# Patient Record
Sex: Male | Born: 1965 | Race: White | Hispanic: No | Marital: Single | Smoking: Current every day smoker
Health system: Southern US, Community
[De-identification: ages and names within clinical notes are randomized; demographics above are authoritative.]

## PROBLEM LIST (undated history)

## (undated) DIAGNOSIS — F419 Anxiety disorder, unspecified: Secondary | ICD-10-CM

## (undated) DIAGNOSIS — K219 Gastro-esophageal reflux disease without esophagitis: Secondary | ICD-10-CM

## (undated) DIAGNOSIS — E559 Vitamin D deficiency, unspecified: Secondary | ICD-10-CM

## (undated) DIAGNOSIS — I359 Nonrheumatic aortic valve disorder, unspecified: Secondary | ICD-10-CM

## (undated) DIAGNOSIS — R011 Cardiac murmur, unspecified: Secondary | ICD-10-CM

## (undated) DIAGNOSIS — I739 Peripheral vascular disease, unspecified: Secondary | ICD-10-CM

## (undated) DIAGNOSIS — I517 Cardiomegaly: Secondary | ICD-10-CM

## (undated) DIAGNOSIS — I350 Nonrheumatic aortic (valve) stenosis: Secondary | ICD-10-CM

## (undated) DIAGNOSIS — I099 Rheumatic heart disease, unspecified: Secondary | ICD-10-CM

## (undated) DIAGNOSIS — F1010 Alcohol abuse, uncomplicated: Secondary | ICD-10-CM

## (undated) DIAGNOSIS — K5909 Other constipation: Secondary | ICD-10-CM

## (undated) DIAGNOSIS — I10 Essential (primary) hypertension: Secondary | ICD-10-CM

## (undated) DIAGNOSIS — R0600 Dyspnea, unspecified: Secondary | ICD-10-CM

## (undated) DIAGNOSIS — I7121 Aneurysm of the ascending aorta, without rupture: Secondary | ICD-10-CM

## (undated) DIAGNOSIS — Z79899 Other long term (current) drug therapy: Secondary | ICD-10-CM

## (undated) DIAGNOSIS — I712 Thoracic aortic aneurysm, without rupture: Secondary | ICD-10-CM

## (undated) DIAGNOSIS — I4892 Unspecified atrial flutter: Secondary | ICD-10-CM

## (undated) DIAGNOSIS — E782 Mixed hyperlipidemia: Secondary | ICD-10-CM

## (undated) HISTORY — DX: Thoracic aortic aneurysm, without rupture: I71.2

## (undated) HISTORY — DX: Cardiac murmur, unspecified: R01.1

## (undated) HISTORY — DX: Nonrheumatic aortic valve disorder, unspecified: I35.9

## (undated) HISTORY — DX: Essential (primary) hypertension: I10

## (undated) HISTORY — DX: Gastro-esophageal reflux disease without esophagitis: K21.9

## (undated) HISTORY — DX: Aneurysm of the ascending aorta, without rupture: I71.21

## (undated) HISTORY — DX: Mixed hyperlipidemia: E78.2

## (undated) HISTORY — DX: Rheumatic heart disease, unspecified: I09.9

## (undated) HISTORY — DX: Other constipation: K59.09

## (undated) HISTORY — DX: Other long term (current) drug therapy: Z79.899

## (undated) HISTORY — DX: Vitamin D deficiency, unspecified: E55.9

## (undated) HISTORY — DX: Cardiomegaly: I51.7

## (undated) HISTORY — DX: Anxiety disorder, unspecified: F41.9

## (undated) HISTORY — PX: AORTIC VALVE REPLACEMENT: SHX41

## (undated) HISTORY — DX: Nonrheumatic aortic (valve) stenosis: I35.0

## (undated) HISTORY — DX: Peripheral vascular disease, unspecified: I73.9

---

## 1984-08-07 HISTORY — PX: TENDON REPAIR: SHX5111

## 1986-08-07 HISTORY — PX: APPENDECTOMY: SHX54

## 2015-12-06 HISTORY — PX: OTHER SURGICAL HISTORY: SHX169

## 2016-04-26 ENCOUNTER — Encounter: Payer: Self-pay | Admitting: *Deleted

## 2016-04-26 ENCOUNTER — Ambulatory Visit (INDEPENDENT_AMBULATORY_CARE_PROVIDER_SITE_OTHER): Payer: BLUE CROSS/BLUE SHIELD | Admitting: Cardiology

## 2016-04-26 VITALS — BP 145/103 | HR 73 | Ht 65.0 in | Wt 147.0 lb

## 2016-04-26 DIAGNOSIS — Z01812 Encounter for preprocedural laboratory examination: Secondary | ICD-10-CM

## 2016-04-26 DIAGNOSIS — I483 Typical atrial flutter: Secondary | ICD-10-CM | POA: Diagnosis not present

## 2016-04-26 NOTE — Patient Instructions (Signed)
Medication Instructions:    Your physician recommends that you continue on your current medications as directed. Please refer to the Current Medication list given to you today.  --- If you need a refill on your cardiac medications before your next appointment, please call your pharmacy. ---  Labwork:  Your physician recommends that you return for lab work week of 10/2-10/6  Testing/Procedures: Your physician has recommended that you have an Atrial flutter Ablation. Catheter ablation is a medical procedure used to treat some cardiac arrhythmias (irregular heartbeats). During catheter ablation, a long, thin, flexible tube is put into a blood vessel in your groin (upper thigh), or neck. This tube is called an ablation catheter. It is then guided to your heart through the blood vessel. Radio frequency waves destroy small areas of heart tissue where abnormal heartbeats may cause an arrhythmia to start. Please see the instruction sheet given to you today.  Follow-Up:  Your physician recommends that you schedule a follow-up appointment in: 4 weeks, after your procedure on 05/17/16, with Dr. Curt Lambert  Thank you for choosing [facility]!!   Trinidad Curet, RN [phone]    Any Other Special Instructions Will Be Listed Below (If Applicable). Cardiac Ablation Cardiac ablation is a procedure to disable a small amount of heart tissue in very specific places. The heart has many electrical connections. Sometimes these connections are abnormal and can cause the heart to beat very fast or irregularly. By disabling some of the problem areas, heart rhythm can be improved or made normal. Ablation is done for people who:   Have Wolff-Parkinson-White syndrome.   Have other fast heart rhythms (tachycardia).   Have taken medicines for an abnormal heart rhythm (arrhythmia) that resulted in:   No success.   Side effects.   May have a high-risk heartbeat that could result in death.  LET [facility] CARE PROVIDER KNOW ABOUT:   Any allergies you have or any previous reactions you have had to X-ray dye, food (such as seafood), medicine, or tape.   All medicines you are taking, including vitamins, herbs, eye drops, creams, and over-the-counter medicines.   Previous problems you or members of your family have had with the use of anesthetics.   Any blood disorders you have.   Previous surgeries or procedures (such as a kidney transplant) you have had.   Medical conditions you have (such as kidney failure).  RISKS AND COMPLICATIONS Generally, cardiac ablation is a safe procedure. However, problems can occur and include:   Increased risk of cancer. Depending on how long it takes to do the ablation, the dose of radiation can be high.  Bruising and bleeding where a thin, flexible tube (catheter) was inserted during the procedure.   Bleeding into the chest, especially into the sac that surrounds the heart (serious).  Need for a permanent pacemaker if the normal electrical system is damaged.   The procedure may not be fully effective, and this may not be recognized for months. Repeat ablation procedures are sometimes required. BEFORE THE PROCEDURE   Follow any instructions from your health care provider regarding eating and drinking before the procedure.   Take your medicines as directed at regular times with water, unless instructed otherwise by your health care provider. If you are taking diabetes medicine, including insulin, ask how you are to take it and if there are any special instructions you should follow. It is common to adjust insulin dosing the day of the ablation.  PROCEDURE  An ablation is  usually performed in a catheterization laboratory with the guidance of fluoroscopy. Fluoroscopy is a type of X-ray that helps your health care provider see images of your heart during the procedure.   An ablation is a minimally invasive procedure. This means a small cut  (incision) is made in either your neck or groin. Your health care provider will decide where to make the incision based on your medical history and physical exam.  An IV tube will be started before the procedure begins. You will be given an anesthetic or medicine to help you relax (sedative).  The skin on your neck or groin will be numbed. A needle will be inserted into a large vein in your neck or groin and catheters will be threaded to your heart.  A special dye that shows up on fluoroscopy pictures may be injected through the catheter. The dye helps your health care provider see the area of the heart that needs treatment.  The catheter has electrodes on the tip. When the area of heart tissue that is causing the arrhythmia is found, the catheter tip will send an electrical current to the area and "scar" the tissue. Three types of energy can be used to ablate the heart tissue:   Heat (radiofrequency energy).   Laser energy.   Extreme cold (cryoablation).   When the area of the heart has been ablated, the catheter will be taken out. Pressure will be held on the insertion site. This will help the insertion site clot and keep it from bleeding. A bandage will be placed on the insertion site.  AFTER THE PROCEDURE   After the procedure, you will be taken to a recovery area where your vital signs (blood pressure, heart rate, and breathing) will be monitored. The insertion site will also be monitored for bleeding.   You will need to lie still for 4-6 hours. This is to ensure you do not bleed from the catheter insertion site.    This information is not intended to replace advice given to you by your health care provider. Make sure you discuss any questions you have with your health care provider.   Document Released: 12/10/2008 Document Revised: 08/14/2014 Document Reviewed: 12/16/2012 Elsevier Interactive Patient Education [employer].

## 2016-04-26 NOTE — Progress Notes (Addendum)
Electrophysiology Office Note   Date:  05/01/2016   ID:  Malik Lambert, DOB 1965-11-09, MRN [id number]  PCP:  No PCP Per Patient  Primary Electrophysiologist:  Beldon Nowling Meredith Leeds, MD    Chief Complaint  Patient presents with  . Atrial Fibrillation     History of Present Illness: Malik Lambert is a 50 y.o. male who presents today for electrophysiology evaluation.   History of atrial fibrillation, atrial flutter, hyperlipidemia, hypertension, AVR. Recently started on Eliquis. 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 Approximately 8 weeks after his operation, he started to notice palpitations and fatigue. On follow-up with his cardiologist, he was noted to be in 2-1 atrial flutter. He was put on Eliquis. Since that time, he has continued to have mild fatigue and palpitations.   Today, he denies symptoms of chest pain, shortness of breath, orthopnea, PND, lower extremity edema, claudication, dizziness, presyncope, syncope, bleeding, or neurologic sequela. The patient is tolerating medications without difficulties and is otherwise without complaint today.    Past Medical History:  Diagnosis Date  . Acid reflux   . Anxiety disorder   . Aortic stenosis   . Aortic valve defect   . Ascending aortic aneurysm ([place])   . Chronic constipation   . Heart murmur   . Heart murmur   . High risk medication use   . Hypertension, essential   . LVH (left ventricular hypertrophy)   . Mixed hyperlipidemia   . PAD (peripheral artery disease) ([place])   . Rheumatic fever/heart disease   . Vitamin D deficiency    Past Surgical History:  Procedure Laterality Date  . APPENDECTOMY  1988  . arotic valve replacement  12/2015  . TENDON REPAIR  1986     Current Outpatient Prescriptions  Medication Sig Dispense Refill  . aspirin 81 MG chewable tablet Chew 81 mg by mouth.    Arne Cleveland 5 MG TABS tablet Use as directed 5 mg in  the mouth or throat 2 (two) times daily.  0  . folic acid (FOLVITE) 1 MG tablet Take 1 mg by mouth.    Marland Kitchen lisinopril (PRINIVIL,ZESTRIL) 20 MG tablet Take 20 mg by mouth.    . metoprolol (LOPRESSOR) 100 MG tablet Take 100 mg by mouth.    . pravastatin (PRAVACHOL) 20 MG tablet Take 20 mg by mouth.     No current facility-administered medications for this visit.     Allergies:   Review of patient's allergies indicates no known allergies.   Social History:  The patient  reports that he has been smoking.  He does not have any smokeless tobacco history on file. He reports that he drinks alcohol. He reports that he does not use drugs.   Family History:  The patient's family history includes Depression in his mother; Diabetes Mellitus II in his father.    ROS:  Please see the history of present illness.   Otherwise, review of systems is positive for fatigue, palpitations, leg swelling.   All other systems are reviewed and negative.    PHYSICAL EXAM: VS:  BP (!) 145/103 (BP Location: Left Arm, Patient Position: Sitting, Cuff Size: Normal)   Pulse 73   Ht 5\' 5"  (1.651 m)   Wt 147 lb (66.7 kg)   BMI 24.46 kg/m  , BMI Body mass index is 24.46 kg/m. GEN: Well nourished, well developed, in no acute distress  HEENT: normal  Neck: no JVD,  carotid bruits, or masses Cardiac: tachycardic, irregular; no murmurs, rubs, or gallops,no edema  Respiratory:  clear to auscultation bilaterally, normal work of breathing GI: soft, nontender, nondistended, + BS MS: no deformity or atrophy  Skin: warm and dry,  Neuro:  Strength and sensation are intact Psych: euthymic mood, full affect  EKG:  EKG is ordered today. Personal review of the ekg ordered shows anial flutter, rate 146  Recent Labs: No results found for requested labs within last 8760 hours.    Lipid Panel  No results found for: CHOL, TRIG, HDL, CHOLHDL, VLDL, LDLCALC, LDLDIRECT   Wt Readings from Last 3 Encounters:  04/26/16 147 lb (66.7  kg)      Other studies Reviewed: Additional studies/ records that were reviewed today include: TTE 04/14/16  Review of the above records today demonstrates:  Mild concentric LVH. Ejection fraction 50-55%. Left atrium markedly dilated. Left atrial diameter 5.6 cm. Right atrium moderately dilated. Normally functioning bioprosthetic aortic valve.   ASSESSMENT AND PLAN:  1.  Atrial fibrillation/atrial flutter: on Eliquis. According to the patient, he had atrial fibrillation and right after his operation, but converted to sinus rhythm. Approximately 8 weeks after his operation, he went into atrial flutter. He says that this occurred up to a month and a half ago. He was put on Eliquis 2 weeks ago. I discussed with him the options of ablation of his medical management. Risks and benefits of ablation were discussed. Risks include bleeding, heart block, stroke, and tamponade. He understands these risks and has agreed to the procedure. We Nature Vogelsang plan the procedure after a work trip for him in the beginning of October.   This patients CHA2DS2-VASc Score and unadjusted Ischemic Stroke Rate (% per year) is equal to 2.2 % stroke rate/year from a score of 2  Above score calculated as 1 point each if present [CHF, HTN, DM, Vascular=MI/PAD/Aortic Plaque, Age if 65-74, or Male] Above score calculated as 2 points each if present [Age > 75, or Stroke/TIA/TE]   2. Hypertension: Well-controlled on lisinopril, metoprolol  3. Hyperlipidemia: Continue pravastatin         Current medicines are reviewed at length with the patient today.   The patient does not have concerns regarding his medicines.  The following changes were made today:  none  Labs/ tests ordered today include:  Orders Placed This Encounter  Procedures  . Basic metabolic panel  . CBC w/Diff  . EKG 12-Lead     Disposition:   FU with Manuella Blackson 3 months  Signed, Curtistine Pettitt Meredith Leeds, MD  05/01/2016 4:39 PM     [address] [place] [address] [phone] (office) [phone] (fax)

## 2016-05-01 ENCOUNTER — Encounter: Payer: Self-pay | Admitting: *Deleted

## 2016-05-04 ENCOUNTER — Telehealth: Payer: Self-pay | Admitting: *Deleted

## 2016-05-04 NOTE — Telephone Encounter (Signed)
Patient is going to have pre procedure labs on 10/5 or 10/6 at lab in [facility]. We spent 20 minutes on the phone discussing procedure and restrictions. He is appreciative of my time to talk with him.

## 2016-05-08 ENCOUNTER — Telehealth: Payer: Self-pay | Admitting: Cardiology

## 2016-05-08 NOTE — Telephone Encounter (Signed)
Walk In Pt Form-Return to Work Paper-Dropped off patient needs completed. Gave to [facility]

## 2016-05-12 ENCOUNTER — Other Ambulatory Visit: Payer: Self-pay | Admitting: *Deleted

## 2016-05-12 ENCOUNTER — Encounter: Payer: Self-pay | Admitting: Cardiology

## 2016-05-17 ENCOUNTER — Ambulatory Visit (HOSPITAL_COMMUNITY)
Admission: RE | Admit: 2016-05-17 | Discharge: 2016-05-17 | Disposition: A | Discharge status: Home / Self Care | Payer: BLUE CROSS/BLUE SHIELD | Source: Ambulatory Visit | Attending: Cardiology | Admitting: Cardiology

## 2016-05-17 ENCOUNTER — Encounter (HOSPITAL_COMMUNITY): Payer: Self-pay | Admitting: Cardiology

## 2016-05-17 ENCOUNTER — Encounter (HOSPITAL_COMMUNITY)
Admission: RE | Disposition: A | Discharge status: Home / Self Care | Payer: Self-pay | Source: Ambulatory Visit | Attending: Cardiology

## 2016-05-17 DIAGNOSIS — I4892 Unspecified atrial flutter: Secondary | ICD-10-CM | POA: Diagnosis present

## 2016-05-17 DIAGNOSIS — I471 Supraventricular tachycardia: Secondary | ICD-10-CM | POA: Diagnosis not present

## 2016-05-17 HISTORY — DX: Unspecified atrial flutter: I48.92

## 2016-05-17 HISTORY — DX: Dyspnea, unspecified: R06.00

## 2016-05-17 HISTORY — PX: ELECTROPHYSIOLOGIC STUDY: SHX172A

## 2016-05-17 HISTORY — PX: ABLATION OF DYSRHYTHMIC FOCUS: SHX254

## 2016-05-17 SURGERY — A-FLUTTER/A-TACH/SVT ABLATION
Anesthesia: LOCAL

## 2016-05-17 MED ORDER — BUPIVACAINE HCL (PF) 0.25 % IJ SOLN
INTRAMUSCULAR | Status: AC
  Filled 2016-05-17: qty 60

## 2016-05-17 MED ORDER — SODIUM CHLORIDE 0.9 % IV SOLN
INTRAVENOUS | Status: DC
  Administered 2016-05-17: 08:00:00 via INTRAVENOUS

## 2016-05-17 MED ORDER — HEPARIN (PORCINE) IN NACL 2-0.9 UNIT/ML-% IJ SOLN
INTRAMUSCULAR | Status: AC
  Filled 2016-05-17: qty 500

## 2016-05-17 MED ORDER — MIDAZOLAM HCL 5 MG/5ML IJ SOLN
INTRAMUSCULAR | Status: AC
  Filled 2016-05-17: qty 5

## 2016-05-17 MED ORDER — BUPIVACAINE HCL (PF) 0.25 % IJ SOLN
INTRAMUSCULAR | Status: DC | PRN
  Administered 2016-05-17: 33 mL

## 2016-05-17 MED ORDER — APIXABAN 5 MG PO TABS
5.0000 mg | ORAL_TABLET | Freq: Two times a day (BID) | ORAL | Status: DC
  Filled 2016-05-17: qty 1

## 2016-05-17 MED ORDER — SODIUM CHLORIDE 0.9% FLUSH: 3.0000 mL | INTRAVENOUS | Status: DC | PRN

## 2016-05-17 MED ORDER — HEPARIN (PORCINE) IN NACL 2-0.9 UNIT/ML-% IJ SOLN
INTRAMUSCULAR | Status: DC | PRN
  Administered 2016-05-17: 500 mL via INTRAVENOUS

## 2016-05-17 MED ORDER — ONDANSETRON HCL 4 MG/2ML IJ SOLN: 4.0000 mg | Freq: Four times a day (QID) | INTRAMUSCULAR | Status: DC | PRN

## 2016-05-17 MED ORDER — METOPROLOL TARTRATE 100 MG PO TABS
100.0000 mg | ORAL_TABLET | Freq: Two times a day (BID) | ORAL | Status: DC
  Administered 2016-05-17: 100 mg via ORAL
  Filled 2016-05-17: qty 1

## 2016-05-17 MED ORDER — SODIUM CHLORIDE 0.9% FLUSH
3.0000 mL | Freq: Two times a day (BID) | INTRAVENOUS | Status: DC
  Administered 2016-05-17: 3 mL via INTRAVENOUS

## 2016-05-17 MED ORDER — ASPIRIN 81 MG PO CHEW
81.0000 mg | CHEWABLE_TABLET | Freq: Every day | ORAL | Status: DC
  Administered 2016-05-17: 81 mg via ORAL
  Filled 2016-05-17: qty 1

## 2016-05-17 MED ORDER — FENTANYL CITRATE (PF) 100 MCG/2ML IJ SOLN
INTRAMUSCULAR | Status: AC
  Filled 2016-05-17: qty 2

## 2016-05-17 MED ORDER — LISINOPRIL 20 MG PO TABS
20.0000 mg | ORAL_TABLET | Freq: Every day | ORAL | Status: DC
  Administered 2016-05-17: 20 mg via ORAL
  Filled 2016-05-17: qty 1

## 2016-05-17 MED ORDER — MIDAZOLAM HCL 5 MG/5ML IJ SOLN
INTRAMUSCULAR | Status: DC | PRN
  Administered 2016-05-17 (×9): 1 mg via INTRAVENOUS

## 2016-05-17 MED ORDER — ACETAMINOPHEN 325 MG PO TABS
650.0000 mg | ORAL_TABLET | ORAL | Status: DC | PRN
  Filled 2016-05-17: qty 2

## 2016-05-17 MED ORDER — PRAVASTATIN SODIUM 20 MG PO TABS
20.0000 mg | ORAL_TABLET | Freq: Every day | ORAL | Status: DC
  Administered 2016-05-17: 20 mg via ORAL
  Filled 2016-05-17: qty 1

## 2016-05-17 MED ORDER — FOLIC ACID 1 MG PO TABS
1.0000 mg | ORAL_TABLET | Freq: Every day | ORAL | Status: DC
  Administered 2016-05-17: 1 mg via ORAL
  Filled 2016-05-17: qty 1

## 2016-05-17 MED ORDER — FENTANYL CITRATE (PF) 100 MCG/2ML IJ SOLN
INTRAMUSCULAR | Status: DC | PRN
  Administered 2016-05-17 (×4): 25 ug via INTRAVENOUS

## 2016-05-17 MED ORDER — SODIUM CHLORIDE 0.9 % IV SOLN: 250.0000 mL | INTRAVENOUS | Status: DC | PRN

## 2016-05-17 SURGICAL SUPPLY — 11 items
BAG SNAP BAND KOVER 36X36 (MISCELLANEOUS) ×2 IMPLANT
CATH EZ STEER NAV 8MM D-F CUR (ABLATOR) ×2 IMPLANT
CATH JOSEPH QUAD ALLRED 6F REP (CATHETERS) ×2 IMPLANT
CATH WEBSTER BI DIR CS D-F CRV (CATHETERS) ×2 IMPLANT
PACK EP LATEX FREE (CUSTOM PROCEDURE TRAY) ×1
PACK EP LF (CUSTOM PROCEDURE TRAY) ×1 IMPLANT
PAD DEFIB LIFELINK (PAD) ×2 IMPLANT
PATCH CARTO3 (PAD) ×2 IMPLANT
SHEATH PINNACLE 6F 10CM (SHEATH) ×2 IMPLANT
SHEATH PINNACLE 7F 10CM (SHEATH) ×2 IMPLANT
SHEATH PINNACLE 8F 10CM (SHEATH) ×2 IMPLANT

## 2016-05-17 NOTE — H&P (Signed)
Malik Lambert is a 50 y.o. male with a history of atrial flutter.  He presents today for ablation.  He has been taking Eliquis for anticoagulation without missing doses.  On exam, tachycardic, no murmurs, lungs clear.  Risks and benefits of the procedure were explained.  Risks include but not limited to bleeding, tamponade, heart block, and stroke.  The patient understands the risks and has agreed to the procedure. All questions were answered.  Elroy Schembri Curt Bears, MD 05/17/2016 7:06 AM

## 2016-05-17 NOTE — Discharge Instructions (Signed)
No driving for 1 week. No lifting over 5 lbs for 1 week. No vigorous or sexual activity for 1 week. You may return to work on 05/24/16. Keep procedure site clean & dry. If you notice increased pain, swelling, bleeding or pus, call/return!  You may shower, but no soaking baths/hot tubs/pools for 1 week.    Heart-Healthy Eating Plan Many factors influence your heart health, including eating and exercise habits. Heart (coronary) risk increases with abnormal blood fat (lipid) levels. Heart-healthy meal planning includes limiting unhealthy fats, increasing healthy fats, and making other small dietary changes. This includes maintaining a healthy body weight to help keep lipid levels within a normal range. WHAT IS MY PLAN?  Your health care provider recommends that you:  Get no more than _________% of the total calories in your daily diet from fat.  Limit your intake of saturated fat to less than _________% of your total calories each day.  Limit the amount of cholesterol in your diet to less than _________ mg per day. WHAT TYPES OF FAT SHOULD I CHOOSE?  Choose healthy fats more often. Choose monounsaturated and polyunsaturated fats, such as olive oil and canola oil, flaxseeds, walnuts, almonds, and seeds.  Eat more omega-3 fats. Good choices include salmon, mackerel, sardines, tuna, flaxseed oil, and ground flaxseeds. Aim to eat fish at least two times each week.  Limit saturated fats. Saturated fats are primarily found in animal products, such as meats, butter, and cream. Plant sources of saturated fats include palm oil, palm kernel oil, and coconut oil.  Avoid foods with partially hydrogenated oils in them. These contain trans fats. Examples of foods that contain trans fats are stick margarine, some tub margarines, cookies, crackers, and other baked goods. WHAT GENERAL GUIDELINES DO I NEED TO FOLLOW?  Check food labels carefully to identify foods with trans fats or high amounts of saturated  fat.  Fill one half of your plate with vegetables and green salads. Eat 4-5 servings of vegetables per day. A serving of vegetables equals 1 cup of raw leafy vegetables,  cup of raw or cooked cut-up vegetables, or  cup of vegetable juice.  Fill one fourth of your plate with whole grains. Look for the word "whole" as the first word in the ingredient list.  Fill one fourth of your plate with lean protein foods.  Eat 4-5 servings of fruit per day. A serving of fruit equals one medium whole fruit,  cup of dried fruit,  cup of fresh, frozen, or canned fruit, or  cup of 100% fruit juice.  Eat more foods that contain soluble fiber. Examples of foods that contain this type of fiber are apples, broccoli, carrots, beans, peas, and barley. Aim to get 20-30 g of fiber per day.  Eat more home-cooked food and less restaurant, buffet, and fast food.  Limit or avoid alcohol.  Limit foods that are high in starch and sugar.  Avoid fried foods.  Cook foods by using methods other than frying. Baking, boiling, grilling, and broiling are all great options. Other fat-reducing suggestions include:  Removing the skin from poultry.  Removing all visible fats from meats.  Skimming the fat off of stews, soups, and gravies before serving them.  Steaming vegetables in water or broth.  Lose weight if you are overweight. Losing just 5-10% of your initial body weight can help your overall health and prevent diseases such as diabetes and heart disease.  Increase your consumption of nuts, legumes, and seeds to 4-5 servings per  week. One serving of dried beans or legumes equals  cup after being cooked, one serving of nuts equals 1 ounces, and one serving of seeds equals  ounce or 1 tablespoon.  You may need to monitor your salt (sodium) intake, especially if you have high blood pressure. Talk with your health care provider or dietitian to get more information about reducing sodium. WHAT FOODS CAN I  EAT? Grains Breads, including [place], white, pita, wheat, raisin, rye, oatmeal, and [place]. Tortillas that are neither fried nor made with lard or trans fat. Low-fat rolls, including hotdog and hamburger buns and English muffins. Biscuits. Muffins. Waffles. Pancakes. Light popcorn. Whole-grain cereals. Flatbread. Melba toast. Pretzels. Breadsticks. Rusks. Low-fat snacks and crackers, including oyster, saltine, matzo, graham, animal, and rye. Rice and pasta, including brown rice and those that are made with whole wheat. Vegetables All vegetables. Fruits All fruits, but limit coconut. Meats and Other Protein Sources Lean, well-trimmed beef, veal, pork, and lamb. Chicken and [place] without skin. All fish and shellfish. Wild duck, rabbit, pheasant, and venison. Egg whites or low-cholesterol egg substitutes. Dried beans, peas, lentils, and tofu.Seeds and most nuts. Dairy Low-fat or nonfat cheeses, including ricotta, string, and mozzarella. Skim or 1% milk that is liquid, powdered, or evaporated. Buttermilk that is made with low-fat milk. Nonfat or low-fat yogurt. Beverages Mineral water. Diet carbonated beverages. Sweets and Desserts Sherbets and fruit ices. Honey, jam, marmalade, jelly, and syrups. Meringues and gelatins. Pure sugar candy, such as hard candy, jelly beans, gumdrops, mints, marshmallows, and small amounts of dark chocolate. [employer] Eat all sweets and desserts in moderation. Fats and Oils Nonhydrogenated (trans-free) margarines. Vegetable oils, including soybean, sesame, sunflower, olive, peanut, safflower, corn, canola, and cottonseed. Salad dressings or mayonnaise that are made with a vegetable oil. Limit added fats and oils that you use for cooking, baking, salads, and as spreads. Other Cocoa powder. Coffee and tea. All seasonings and condiments. The items listed above may not be a complete list of recommended foods or beverages. Contact your dietitian for more  options. WHAT FOODS ARE NOT RECOMMENDED? Grains Breads that are made with saturated or trans fats, oils, or whole milk. Croissants. Butter rolls. Cheese breads. Sweet rolls. Donuts. Buttered popcorn. Chow mein noodles. High-fat crackers, such as cheese or butter crackers. Meats and Other Protein Sources Fatty meats, such as hotdogs, short ribs, sausage, spareribs, bacon, ribeye roast or steak, and mutton. High-fat deli meats, such as salami and bologna. Caviar. Domestic duck and goose. Organ meats, such as kidney, liver, sweetbreads, brains, gizzard, chitterlings, and heart. Dairy Cream, sour cream, cream cheese, and creamed cottage cheese. Whole milk cheeses, including blue (bleu), Monterey Jack, [place], [place], American, [place], [place], [place], [place], and [place]. Whole or 2% milk that is liquid, evaporated, or condensed. Whole buttermilk. Cream sauce or high-fat cheese sauce. Yogurt that is made from whole milk. Beverages Regular sodas and drinks with added sugar. Sweets and Desserts Frosting. Pudding. Cookies. Cakes other than angel food cake. Candy that has milk chocolate or white chocolate, hydrogenated fat, butter, coconut, or unknown ingredients. Buttered syrups. Full-fat ice cream or ice cream drinks. Fats and Oils Gravy that has suet, meat fat, or shortening. Cocoa butter, hydrogenated oils, palm oil, coconut oil, palm kernel oil. These can often be found in baked products, candy, fried foods, nondairy creamers, and whipped toppings. Solid fats and shortenings, including bacon fat, salt pork, lard, and butter. Nondairy cream substitutes, such as coffee creamers and sour cream substitutes. Salad dressings that are  made of unknown oils, cheese, or sour cream. The items listed above may not be a complete list of foods and beverages to avoid. Contact your dietitian for more information.   This information is not intended to replace advice given to you by your health care provider. Make sure  you discuss any questions you have with your health care provider.   Document Released: 05/02/2008 Document Revised: 08/14/2014 Document Reviewed: 01/15/2014 Elsevier Interactive Patient Education [employer].

## 2016-05-17 NOTE — Progress Notes (Signed)
Patient is seen by Dr. Curt Bears, cleared for discharge once bedrest is completed and ambulates without difficulty or site complication. (d/w RN) Procedure site stable Telemetry is SR VSS Activity restrictions were discussed with the patient by dr. Curt Bears F/u arranged.  Tommye Standard, PA-C  Leonides Sake, MD

## 2016-05-17 NOTE — Progress Notes (Signed)
Site area: Right groin a 6, 7, 8 french venous sheath was removed  Site Prior to Removal:  Level 0  Pressure Applied For 20 MINUTES    Bedrest Beginning at 1045a  Manual:   Yes.    Patient Status During Pull:  stable  Post Pull Groin Site:  Level 0  Post Pull Instructions Given:  Yes.    Post Pull Pulses Present:  Yes.    Dressing Applied:  Yes.    Comments:  VS remain stable during sheath pull

## 2016-05-22 ENCOUNTER — Telehealth: Payer: Self-pay | Admitting: Cardiology

## 2016-05-22 NOTE — Telephone Encounter (Signed)
Patient still not feeling well.  He is scheduled to see his primary cardiologist (Dr. Verdon Cummins office at [facility]) in the morning to address issues. Complains of bilateral calf pain -- right greater left.  States that they are hypersensitive.  Also reports bilateral pedal edema which is not new -- he had this problem prior to ablation.   He understands I will call Wednesday to follow up and see what orders/recommendations were made at cardiologist. He thanks me for calling.

## 2016-05-22 NOTE — Telephone Encounter (Signed)
New message    Patient calling C/O cramping in calf area more in right. S/p ablation on last Wednesday.    Patient aware appt in [place] with Dr. Curt Bears - wants nurse to clarify on which location patient needs to be seen at [facility] or [facility] due to paperwork saying on different date and time   Pt C/O swelling: STAT is pt has developed SOB within 24 hours  1. How long have you been experiencing swelling? Before the surgery    2. Where is the swelling located? Feet - both / hard time walking around   3.  Are you currently taking a "fluid pill"? No   4.  Are you currently SOB? No   5.  Have you traveled recently? No

## 2016-06-02 NOTE — Telephone Encounter (Signed)
Followed up with patient.  He tells me he is doing better.  Saw doc last week and they started Lasix.  States he has a couple more pills and then he is done with it.  Reports Lasix has helped reduce ankle swelling but he has also lost 9 pds. Reports cramping is gone. He follows up next Wednesday with PA in doctors office. He will call office if he needs anything further related to this issue.  Otherwise, he will see [place] near end of November for follow up. He thanks me for being so thoughtful and following up.

## 2016-06-28 ENCOUNTER — Ambulatory Visit: Payer: BLUE CROSS/BLUE SHIELD | Admitting: Cardiology

## 2016-06-28 ENCOUNTER — Ambulatory Visit (INDEPENDENT_AMBULATORY_CARE_PROVIDER_SITE_OTHER): Payer: BLUE CROSS/BLUE SHIELD | Admitting: Cardiology

## 2016-06-28 VITALS — BP 182/100 | HR 78 | Ht 65.0 in | Wt 145.0 lb

## 2016-06-28 DIAGNOSIS — I4892 Unspecified atrial flutter: Secondary | ICD-10-CM | POA: Diagnosis not present

## 2016-06-28 NOTE — Patient Instructions (Signed)
Medication Instructions: - Your physician recommends that you continue on your current medications as directed. Please refer to the Current Medication list given to you today.  Labwork: - none ordered  Procedures/Testing: - none ordered  Follow-Up: - Your physician wants you to follow-up in: 6 months with Dr. Curt Bears in the [facility] office.  You will receive a reminder letter in the mail two months in advance. If you don't receive a letter, please call our office to schedule the follow-up appointment.  Any Additional Special Instructions Will Be Listed Below (If Applicable).     If you need a refill on your cardiac medications before your next appointment, please call your pharmacy.

## 2016-06-28 NOTE — Progress Notes (Signed)
Electrophysiology Office Note   Date:  06/28/2016   ID:  Malik Lambert, DOB 1966-07-23, MRN [id number]  PCP:  Isaias Cowman, PA-C  Primary Electrophysiologist:  Constance Haw, MD    Chief Complaint  Patient presents with  . Atrial Flutter     History of Present Illness: Malik Lambert is a 50 y.o. male who presents today for electrophysiology evaluation.   History of atrial fibrillation, atrial flutter, hyperlipidemia, hypertension, AVR. Recently started on Eliquis. 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 He had an atrial flutter ablation complicated by a steam pop.  The procedure was canceled and block was not achieved across the CTI line. Since that time, he had significant lower extremity edema after the procedure. He was prescribed by mouth Lasix which greatly improved his edema.  Today, he denies symptoms of chest pain, shortness of breath, orthopnea, PND, lower extremity edema, claudication, dizziness, presyncope, syncope, bleeding, or neurologic sequela. The patient is tolerating medications without difficulties and is otherwise without complaint today.    Past Medical History:  Diagnosis Date  . Acid reflux   . Anxiety disorder   . Aortic stenosis   . Aortic valve defect   . Ascending aortic aneurysm ([place])   . Atrial flutter ([place])   . Chronic constipation   . Dyspnea   . Heart murmur   . Heart murmur   . High risk medication use   . Hypertension, essential   . LVH (left ventricular hypertrophy)   . Mixed hyperlipidemia   . PAD (peripheral artery disease) ([place])   . Rheumatic fever/heart disease   . Vitamin D deficiency    Past Surgical History:  Procedure Laterality Date  . ABLATION OF DYSRHYTHMIC FOCUS  05/17/2016   atrial flutter  . APPENDECTOMY  1988  . arotic valve replacement  12/2015  . ELECTROPHYSIOLOGIC STUDY N/A 05/17/2016   Procedure: A-Flutter Ablation;  Surgeon:  Will Meredith Leeds, MD;  Location: [facility];  Service: Cardiovascular;  Laterality: N/A;  . TENDON REPAIR  1986     Current Outpatient Prescriptions  Medication Sig Dispense Refill  . aspirin 81 MG chewable tablet Chew 81 mg by mouth daily.     Marland Kitchen ELIQUIS 5 MG TABS tablet Take 5 mg by mouth 2 (two) times daily.   0  . folic acid (FOLVITE) 1 MG tablet Take 1 mg by mouth daily.     Marland Kitchen lisinopril (PRINIVIL,ZESTRIL) 20 MG tablet Take 20 mg by mouth daily.     . metoprolol (LOPRESSOR) 100 MG tablet Take 100 mg by mouth 2 (two) times daily.     . pravastatin (PRAVACHOL) 20 MG tablet Take 20 mg by mouth daily.      No current facility-administered medications for this visit.     Allergies:   Patient has no known allergies.   Social History:  The patient  reports that he has been smoking Cigarettes.  He has a 30.00 pack-year smoking history. He has never used smokeless tobacco. He reports that he drinks alcohol. He reports that he does not use drugs.   Family History:  The patient's family history includes Depression in his mother; Diabetes Mellitus II in his father.    ROS:  Please see the history of present illness.   Otherwise, review of systems is positive for leg pain, joint swelling, balance problems, muscle pain.   All other systems are reviewed and negative.  PHYSICAL EXAM: VS:  BP (!) 182/100   Pulse 78   Ht 5\' 5"  (1.651 m)   Wt 145 lb (65.8 kg)   BMI 24.13 kg/m  , BMI Body mass index is 24.13 kg/m. GEN: Well nourished, well developed, in no acute distress  HEENT: normal  Neck: no JVD, carotid bruits, or masses Cardiac: RRR; no murmurs, rubs, or gallops,no edema  Respiratory:  clear to auscultation bilaterally, normal work of breathing GI: soft, nontender, nondistended, + BS MS: no deformity or atrophy  Skin: warm and dry,  Neuro:  Strength and sensation are intact Psych: euthymic mood, full affect  EKG:  EKG is ordered today. Personal review of the ekg  ordered shows sinus rhythm, anterior Q waves, rate 79  Recent Labs: No results found for requested labs within last 8760 hours.    Lipid Panel  No results found for: CHOL, TRIG, HDL, CHOLHDL, VLDL, LDLCALC, LDLDIRECT   Wt Readings from Last 3 Encounters:  06/28/16 145 lb (65.8 kg)  05/17/16 147 lb (66.7 kg)  04/26/16 147 lb (66.7 kg)      Other studies Reviewed: Additional studies/ records that were reviewed today include: TTE 04/14/16  Review of the above records today demonstrates:  Mild concentric LVH. Ejection fraction 50-55%. Left atrium markedly dilated. Left atrial diameter 5.6 cm. Right atrium moderately dilated. Normally functioning bioprosthetic aortic valve.   ASSESSMENT AND PLAN:  1.  Atrial fibrillation/atrial flutter: on Eliquis. Had flutter ablation complicated by steam pop. Block was not achieved across the ablation line. The patient wishes to have another attempt at ablation prior to the end of the year.  Risks and benefits discussed.  Risks include but not limited to bleeding, tamponade, heart block, and stroke.  The patient understands the risks of the procedure, but feels that he would prefer to wait and see if the arrhythmia returns.  This patients CHA2DS2-VASc Score and unadjusted Ischemic Stroke Rate (% per year) is equal to 2.2 % stroke rate/year from a score of 2  Above score calculated as 1 point each if present [CHF, HTN, DM, Vascular=MI/PAD/Aortic Plaque, Age if 65-74, or Male] Above score calculated as 2 points each if present [Age > 75, or Stroke/TIA/TE]  2. Hypertension: Elevated in clinic today. I have asked him to check his blood pressure every day to see if his blood pressure will return back to normal. He does say that his blood pressure at home is in the 120s. He will call us back with blood pressure results and we will adjust his medications at that time.  3. Hyperlipidemia: Continue pravastatin   Current medicines are reviewed at length with  the patient today.   The patient does not have concerns regarding his medicines.  The following changes were made today:  none  Labs/ tests ordered today include:  Orders Placed This Encounter  Procedures  . EKG 12-Lead     Disposition:   FU with Will Camnitz 6 months  Signed, Will Meredith Leeds, MD  06/28/2016 4:40 PM     [address] [place] [address] [phone] (office) [phone] (fax)

## 2017-01-27 ENCOUNTER — Inpatient Hospital Stay (HOSPITAL_COMMUNITY)
Admission: EM | Admit: 2017-01-27 | Discharge: 2017-02-02 | DRG: 481 | Disposition: A | Discharge status: Home Health | Payer: BLUE CROSS/BLUE SHIELD | Attending: Family Medicine | Admitting: Family Medicine

## 2017-01-27 ENCOUNTER — Emergency Department (HOSPITAL_COMMUNITY): Payer: BLUE CROSS/BLUE SHIELD

## 2017-01-27 ENCOUNTER — Encounter (HOSPITAL_COMMUNITY): Payer: Self-pay | Admitting: Emergency Medicine

## 2017-01-27 DIAGNOSIS — D638 Anemia in other chronic diseases classified elsewhere: Secondary | ICD-10-CM | POA: Diagnosis present

## 2017-01-27 DIAGNOSIS — I350 Nonrheumatic aortic (valve) stenosis: Secondary | ICD-10-CM | POA: Diagnosis present

## 2017-01-27 DIAGNOSIS — W109XXA Fall (on) (from) unspecified stairs and steps, initial encounter: Secondary | ICD-10-CM | POA: Diagnosis present

## 2017-01-27 DIAGNOSIS — E782 Mixed hyperlipidemia: Secondary | ICD-10-CM | POA: Diagnosis present

## 2017-01-27 DIAGNOSIS — S72141A Displaced intertrochanteric fracture of right femur, initial encounter for closed fracture: Secondary | ICD-10-CM | POA: Diagnosis present

## 2017-01-27 DIAGNOSIS — I739 Peripheral vascular disease, unspecified: Secondary | ICD-10-CM | POA: Diagnosis present

## 2017-01-27 DIAGNOSIS — K644 Residual hemorrhoidal skin tags: Secondary | ICD-10-CM | POA: Diagnosis present

## 2017-01-27 DIAGNOSIS — D62 Acute posthemorrhagic anemia: Secondary | ICD-10-CM | POA: Diagnosis present

## 2017-01-27 DIAGNOSIS — K5730 Diverticulosis of large intestine without perforation or abscess without bleeding: Secondary | ICD-10-CM | POA: Diagnosis present

## 2017-01-27 DIAGNOSIS — I4892 Unspecified atrial flutter: Secondary | ICD-10-CM | POA: Diagnosis present

## 2017-01-27 DIAGNOSIS — I10 Essential (primary) hypertension: Secondary | ICD-10-CM | POA: Diagnosis not present

## 2017-01-27 DIAGNOSIS — S7223XA Displaced subtrochanteric fracture of unspecified femur, initial encounter for closed fracture: Secondary | ICD-10-CM | POA: Diagnosis present

## 2017-01-27 DIAGNOSIS — M2540 Effusion, unspecified joint: Secondary | ICD-10-CM

## 2017-01-27 DIAGNOSIS — I959 Hypotension, unspecified: Secondary | ICD-10-CM | POA: Diagnosis present

## 2017-01-27 DIAGNOSIS — D509 Iron deficiency anemia, unspecified: Secondary | ICD-10-CM | POA: Diagnosis present

## 2017-01-27 DIAGNOSIS — K219 Gastro-esophageal reflux disease without esophagitis: Secondary | ICD-10-CM | POA: Diagnosis present

## 2017-01-27 DIAGNOSIS — D125 Benign neoplasm of sigmoid colon: Secondary | ICD-10-CM | POA: Diagnosis present

## 2017-01-27 DIAGNOSIS — M25461 Effusion, right knee: Secondary | ICD-10-CM | POA: Diagnosis present

## 2017-01-27 DIAGNOSIS — F10230 Alcohol dependence with withdrawal, uncomplicated: Secondary | ICD-10-CM | POA: Diagnosis not present

## 2017-01-27 DIAGNOSIS — E871 Hypo-osmolality and hyponatremia: Secondary | ICD-10-CM | POA: Diagnosis present

## 2017-01-27 DIAGNOSIS — R000 Tachycardia, unspecified: Secondary | ICD-10-CM | POA: Diagnosis not present

## 2017-01-27 DIAGNOSIS — Z952 Presence of prosthetic heart valve: Secondary | ICD-10-CM

## 2017-01-27 DIAGNOSIS — M25551 Pain in right hip: Secondary | ICD-10-CM

## 2017-01-27 DIAGNOSIS — W19XXXD Unspecified fall, subsequent encounter: Secondary | ICD-10-CM | POA: Diagnosis not present

## 2017-01-27 DIAGNOSIS — I110 Hypertensive heart disease with heart failure: Secondary | ICD-10-CM | POA: Diagnosis present

## 2017-01-27 DIAGNOSIS — D696 Thrombocytopenia, unspecified: Secondary | ICD-10-CM | POA: Diagnosis present

## 2017-01-27 DIAGNOSIS — F1010 Alcohol abuse, uncomplicated: Secondary | ICD-10-CM | POA: Diagnosis not present

## 2017-01-27 DIAGNOSIS — K921 Melena: Secondary | ICD-10-CM | POA: Diagnosis not present

## 2017-01-27 DIAGNOSIS — F17210 Nicotine dependence, cigarettes, uncomplicated: Secondary | ICD-10-CM | POA: Diagnosis present

## 2017-01-27 DIAGNOSIS — F10239 Alcohol dependence with withdrawal, unspecified: Secondary | ICD-10-CM | POA: Diagnosis present

## 2017-01-27 DIAGNOSIS — I5032 Chronic diastolic (congestive) heart failure: Secondary | ICD-10-CM | POA: Diagnosis present

## 2017-01-27 DIAGNOSIS — Z7982 Long term (current) use of aspirin: Secondary | ICD-10-CM

## 2017-01-27 DIAGNOSIS — I4891 Unspecified atrial fibrillation: Secondary | ICD-10-CM | POA: Diagnosis present

## 2017-01-27 DIAGNOSIS — Z953 Presence of xenogenic heart valve: Secondary | ICD-10-CM

## 2017-01-27 DIAGNOSIS — K922 Gastrointestinal hemorrhage, unspecified: Secondary | ICD-10-CM | POA: Diagnosis not present

## 2017-01-27 DIAGNOSIS — Z7901 Long term (current) use of anticoagulants: Secondary | ICD-10-CM

## 2017-01-27 DIAGNOSIS — Z8673 Personal history of transient ischemic attack (TIA), and cerebral infarction without residual deficits: Secondary | ICD-10-CM

## 2017-01-27 DIAGNOSIS — K7030 Alcoholic cirrhosis of liver without ascites: Secondary | ICD-10-CM | POA: Diagnosis present

## 2017-01-27 DIAGNOSIS — Z01810 Encounter for preprocedural cardiovascular examination: Secondary | ICD-10-CM | POA: Diagnosis not present

## 2017-01-27 DIAGNOSIS — Y909 Presence of alcohol in blood, level not specified: Secondary | ICD-10-CM | POA: Diagnosis present

## 2017-01-27 DIAGNOSIS — W19XXXA Unspecified fall, initial encounter: Secondary | ICD-10-CM

## 2017-01-27 DIAGNOSIS — I060 Rheumatic aortic stenosis: Secondary | ICD-10-CM | POA: Diagnosis present

## 2017-01-27 DIAGNOSIS — I493 Ventricular premature depolarization: Secondary | ICD-10-CM | POA: Diagnosis not present

## 2017-01-27 DIAGNOSIS — Z79899 Other long term (current) drug therapy: Secondary | ICD-10-CM

## 2017-01-27 DIAGNOSIS — K641 Second degree hemorrhoids: Secondary | ICD-10-CM | POA: Diagnosis present

## 2017-01-27 HISTORY — DX: Gastro-esophageal reflux disease without esophagitis: K21.9

## 2017-01-27 HISTORY — DX: Alcohol abuse, uncomplicated: F10.10

## 2017-01-27 LAB — MRSA PCR SCREENING: MRSA BY PCR: NEGATIVE

## 2017-01-27 LAB — HEPARIN LEVEL (UNFRACTIONATED): HEPARIN UNFRACTIONATED: 0.7 [IU]/mL (ref 0.30–0.70)

## 2017-01-27 LAB — BASIC METABOLIC PANEL
Anion gap: 15 (ref 5–15)
BUN: 9 mg/dL (ref 6–20)
CO2: 16 mmol/L — ABNORMAL LOW (ref 22–32)
Calcium: 8.3 mg/dL — ABNORMAL LOW (ref 8.9–10.3)
Chloride: 97 mmol/L — ABNORMAL LOW (ref 101–111)
Creatinine, Ser: 0.87 mg/dL (ref 0.61–1.24)
GFR calc Af Amer: >60 mL/min (ref >60)
GLUCOSE: 99 mg/dL (ref 65–99)
POTASSIUM: 4.9 mmol/L (ref 3.5–5.1)
Sodium: 128 mmol/L — ABNORMAL LOW (ref 135–145)

## 2017-01-27 LAB — HEMOGLOBIN AND HEMATOCRIT, BLOOD
HCT: 20.1 % — ABNORMAL LOW (ref 39.0–52.0)
HEMOGLOBIN: 7.5 g/dL — AB (ref 13.0–17.0)

## 2017-01-27 LAB — CBC WITH DIFFERENTIAL/PLATELET
BASOS PCT: 0 %
Basophils Absolute: 0 10*3/uL (ref 0.0–0.1)
EOS ABS: 0 10*3/uL (ref 0.0–0.7)
Eosinophils Relative: 0 %
HCT: 23.9 % — ABNORMAL LOW (ref 39.0–52.0)
HEMOGLOBIN: 9 g/dL — AB (ref 13.0–17.0)
LYMPHS PCT: 10 %
Lymphs Abs: 1.2 10*3/uL (ref 0.7–4.0)
MCH: 36.9 pg — AB (ref 26.0–34.0)
MCHC: 37.7 g/dL — ABNORMAL HIGH (ref 30.0–36.0)
MCV: 98 fL (ref 78.0–100.0)
Monocytes Absolute: 1.9 10*3/uL — ABNORMAL HIGH (ref 0.1–1.0)
Monocytes Relative: 16 %
NEUTROS PCT: 74 %
Neutro Abs: 8.5 10*3/uL — ABNORMAL HIGH (ref 1.7–7.7)
Platelets: 75 10*3/uL — ABNORMAL LOW (ref 150–400)
RBC: 2.44 MIL/uL — ABNORMAL LOW (ref 4.22–5.81)
RDW: 13.1 % (ref 11.5–15.5)
WBC: 11.6 10*3/uL — ABNORMAL HIGH (ref 4.0–10.5)

## 2017-01-27 LAB — TSH: TSH: 3.969 u[IU]/mL (ref 0.350–4.500)

## 2017-01-27 LAB — RETICULOCYTES
RBC.: 2.2 MIL/uL — AB (ref 4.22–5.81)
RETIC COUNT ABSOLUTE: 55 10*3/uL (ref 19.0–186.0)
Retic Ct Pct: 2.5 % (ref 0.4–3.1)

## 2017-01-27 LAB — PROTIME-INR
INR: 1.12
PROTHROMBIN TIME: 14.5 s (ref 11.4–15.2)

## 2017-01-27 LAB — APTT: APTT: 37 s — AB (ref 24–36)

## 2017-01-27 LAB — OSMOLALITY: Osmolality: 269 mOsm/kg — ABNORMAL LOW (ref 275–295)

## 2017-01-27 LAB — URIC ACID: Uric Acid, Serum: 4.8 mg/dL (ref 4.4–7.6)

## 2017-01-27 MED ORDER — LORAZEPAM 2 MG/ML IJ SOLN: 0.0000 mg | Freq: Two times a day (BID) | INTRAMUSCULAR | Status: AC

## 2017-01-27 MED ORDER — METOPROLOL TARTRATE 25 MG PO TABS: 100.0000 mg | ORAL_TABLET | Freq: Two times a day (BID) | ORAL | Status: DC

## 2017-01-27 MED ORDER — SODIUM CHLORIDE 0.9 % IV SOLN
INTRAVENOUS | Status: AC
  Administered 2017-01-27 – 2017-01-28 (×2): via INTRAVENOUS

## 2017-01-27 MED ORDER — MORPHINE SULFATE (PF) 2 MG/ML IV SOLN
2.0000 mg | INTRAVENOUS | Status: DC | PRN
  Administered 2017-01-28: 2 mg via INTRAVENOUS
  Filled 2017-01-27 (×2): qty 1

## 2017-01-27 MED ORDER — SODIUM CHLORIDE 0.9 % IV BOLUS (SEPSIS)
500.0000 mL | Freq: Once | INTRAVENOUS | Status: AC
  Administered 2017-01-27 (×2): 500 mL via INTRAVENOUS

## 2017-01-27 MED ORDER — LORAZEPAM 2 MG/ML IJ SOLN
1.0000 mg | Freq: Once | INTRAMUSCULAR | Status: AC
  Administered 2017-01-27: 1 mg via INTRAVENOUS
  Filled 2017-01-27: qty 1

## 2017-01-27 MED ORDER — ADULT MULTIVITAMIN W/MINERALS CH
1.0000 | ORAL_TABLET | Freq: Every day | ORAL | Status: DC
  Administered 2017-01-27 – 2017-02-01 (×5): 1 via ORAL
  Filled 2017-01-27 (×5): qty 1

## 2017-01-27 MED ORDER — LORAZEPAM 2 MG/ML IJ SOLN: 2.0000 mg | INTRAMUSCULAR | Status: AC | PRN

## 2017-01-27 MED ORDER — AMIODARONE HCL IN DEXTROSE 360-4.14 MG/200ML-% IV SOLN
60.0000 mg/h | INTRAVENOUS | Status: AC
  Administered 2017-01-27: 60 mg/h via INTRAVENOUS
  Filled 2017-01-27 (×2): qty 200

## 2017-01-27 MED ORDER — ALBUTEROL SULFATE (2.5 MG/3ML) 0.083% IN NEBU: 2.5000 mg | INHALATION_SOLUTION | RESPIRATORY_TRACT | Status: DC | PRN

## 2017-01-27 MED ORDER — THIAMINE HCL 100 MG/ML IJ SOLN
100.0000 mg | Freq: Every day | INTRAMUSCULAR | Status: DC
  Filled 2017-01-27: qty 2

## 2017-01-27 MED ORDER — HYDROMORPHONE HCL 2 MG/ML IJ SOLN
1.0000 mg | INTRAMUSCULAR | Status: AC | PRN
  Administered 2017-01-27 (×2): 1 mg via INTRAVENOUS
  Filled 2017-01-27 (×2): qty 1

## 2017-01-27 MED ORDER — SODIUM CHLORIDE 0.9 % IV SOLN
INTRAVENOUS | Status: DC
  Administered 2017-01-27: 15:00:00 via INTRAVENOUS

## 2017-01-27 MED ORDER — CHLORDIAZEPOXIDE HCL 5 MG PO CAPS
15.0000 mg | ORAL_CAPSULE | Freq: Three times a day (TID) | ORAL | Status: DC
  Administered 2017-01-27: 15 mg via ORAL
  Filled 2017-01-27: qty 1

## 2017-01-27 MED ORDER — AMIODARONE LOAD VIA INFUSION
150.0000 mg | Freq: Once | INTRAVENOUS | Status: AC
  Administered 2017-01-27: 150 mg via INTRAVENOUS
  Filled 2017-01-27: qty 83.34

## 2017-01-27 MED ORDER — SODIUM CHLORIDE 0.9 % IV BOLUS (SEPSIS)
1000.0000 mL | Freq: Once | INTRAVENOUS | Status: AC
  Administered 2017-01-27: 1000 mL via INTRAVENOUS

## 2017-01-27 MED ORDER — FOLIC ACID 1 MG PO TABS
1.0000 mg | ORAL_TABLET | Freq: Every day | ORAL | Status: DC
  Administered 2017-01-27 – 2017-02-01 (×5): 1 mg via ORAL
  Filled 2017-01-27 (×5): qty 1

## 2017-01-27 MED ORDER — AMIODARONE LOAD VIA INFUSION
150.0000 mg | Freq: Once | INTRAVENOUS | Status: DC
  Filled 2017-01-27: qty 83.34

## 2017-01-27 MED ORDER — VITAMIN B-1 100 MG PO TABS
100.0000 mg | ORAL_TABLET | Freq: Every day | ORAL | Status: DC
  Administered 2017-01-27 – 2017-02-02 (×6): 100 mg via ORAL
  Filled 2017-01-27 (×6): qty 1

## 2017-01-27 MED ORDER — HEPARIN (PORCINE) IN NACL 100-0.45 UNIT/ML-% IJ SOLN
1000.0000 [IU]/h | INTRAMUSCULAR | Status: DC
  Administered 2017-01-27: 1000 [IU]/h via INTRAVENOUS
  Filled 2017-01-27 (×2): qty 250

## 2017-01-27 MED ORDER — METOPROLOL TARTRATE 5 MG/5ML IV SOLN: 2.5000 mg | INTRAVENOUS | Status: DC | PRN

## 2017-01-27 MED ORDER — LORAZEPAM 2 MG/ML IJ SOLN
0.0000 mg | Freq: Four times a day (QID) | INTRAMUSCULAR | Status: AC
  Filled 2017-01-27: qty 1

## 2017-01-27 MED ORDER — FLUTICASONE PROPIONATE 50 MCG/ACT NA SUSP
2.0000 | Freq: Every day | NASAL | Status: DC
  Administered 2017-01-27 – 2017-02-02 (×6): 2 via NASAL
  Filled 2017-01-27: qty 16

## 2017-01-27 MED ORDER — LORAZEPAM 1 MG PO TABS
1.0000 mg | ORAL_TABLET | Freq: Four times a day (QID) | ORAL | Status: AC | PRN
  Administered 2017-01-29: 1 mg via ORAL
  Filled 2017-01-27: qty 1

## 2017-01-27 MED ORDER — AMIODARONE HCL IN DEXTROSE 360-4.14 MG/200ML-% IV SOLN: 30.0000 mg/h | INTRAVENOUS | Status: DC

## 2017-01-27 MED ORDER — PRAVASTATIN SODIUM 20 MG PO TABS
20.0000 mg | ORAL_TABLET | Freq: Every day | ORAL | Status: DC
  Administered 2017-01-27 – 2017-02-01 (×6): 20 mg via ORAL
  Filled 2017-01-27 (×6): qty 1

## 2017-01-27 MED ORDER — DILTIAZEM HCL-DEXTROSE 100-5 MG/100ML-% IV SOLN (PREMIX)
5.0000 mg/h | INTRAVENOUS | Status: DC
  Filled 2017-01-27: qty 100

## 2017-01-27 MED ORDER — SODIUM CHLORIDE 0.9 % IV SOLN: 1000.0000 mL | INTRAVENOUS | Status: DC

## 2017-01-27 MED ORDER — ONDANSETRON HCL 4 MG/2ML IJ SOLN
4.0000 mg | Freq: Four times a day (QID) | INTRAMUSCULAR | Status: DC | PRN
  Administered 2017-01-29: 4 mg via INTRAVENOUS

## 2017-01-27 MED ORDER — HYDROCODONE-ACETAMINOPHEN 5-325 MG PO TABS
1.0000 | ORAL_TABLET | Freq: Four times a day (QID) | ORAL | Status: DC | PRN
  Administered 2017-01-28 – 2017-01-29 (×3): 1 via ORAL
  Administered 2017-01-29 – 2017-02-02 (×13): 2 via ORAL
  Filled 2017-01-27 (×3): qty 2
  Filled 2017-01-27: qty 1
  Filled 2017-01-27 (×2): qty 2
  Filled 2017-01-27: qty 1
  Filled 2017-01-27 (×3): qty 2
  Filled 2017-01-27: qty 1
  Filled 2017-01-27 (×5): qty 2

## 2017-01-27 MED ORDER — DILTIAZEM LOAD VIA INFUSION
10.0000 mg | Freq: Once | INTRAVENOUS | Status: DC
  Filled 2017-01-27: qty 10

## 2017-01-27 MED ORDER — SODIUM CHLORIDE 0.9 % IV BOLUS (SEPSIS)
500.0000 mL | Freq: Once | INTRAVENOUS | Status: AC
  Administered 2017-01-27: 500 mL via INTRAVENOUS

## 2017-01-27 NOTE — H&P (Addendum)
TRH H&P   Patient Demographics:    Malik Lambert, is a 51 y.o. male  MRN: [id number]   DOB - 02/08/1966  Admit Date - 01/27/2017  Outpatient Primary MD for the patient is Secundino Ginger, PA-C  Outpatient Specialists: Dr Baird Kay    Patient coming from: Home  Chief Complaint  Patient presents with  . Fall  . Hip Pain      HPI:    Malik Lambert  is a 51 y.o. male, History of atrial fibrillation, rheumatic heart disease with severe aortic stenosis status post bovine aortic valve replacement surgery in May 2017 at [facility], on Eliquis and aspirin, essential hypertension, chronic diastolic dysfunction, PAD, dyslipidemia, severe alcohol use who lives at home alone and this morning tripped on a staircase and fell down, he subsequently had right-sided hip pain and came to the ER.  In the ER workup suggested he was in sinus tachycardia with a few runs of a flutter, right-sided intertrochanteric hip fracture, mild hyponatremia, mild thrombo-cytopenia, orthopedics was consulted and I was called to admit. Patient currently besides constant right-sided sharp hip pain which is nonradiating, worse with bearing weight and movement better with rest and pain medications, no other associated symptoms or complaints.  Denies any headache, no focal weakness, no chest pain or palpitations that he can feel, no abdominal pain, no blood in stool or urine, no focal weakness. He is getting little agitated and has fine tremors already.    Review of systems:    In addition to the HPI above,   No Fever-chills, No Headache, No changes with Vision or hearing, No problems swallowing food or Liquids, No Chest pain, Cough or Shortness of Breath, No  Abdominal pain, No Nausea or Vommitting, Bowel movements are regular, No Blood in stool or Urine, No dysuria, No new skin rashes or bruises, No new joints pains-aches, Except as above No new weakness, tingling, numbness in any extremity, No recent weight gain or loss, No polyuria, polydypsia or polyphagia, No significant Mental Stressors.  A full 10 point Review of Systems was done, except as stated above, all other Review of Systems were negative.   With Past History of the following :    Past Medical History:  Diagnosis Date  . Acid reflux   . Alcohol abuse   . Anxiety disorder   .  Aortic stenosis   . Aortic valve defect   . Ascending aortic aneurysm ([place])   . Atrial flutter ([place])   . Chronic constipation   . Dyspnea   . GERD (gastroesophageal reflux disease)   . Heart murmur   . Heart murmur   . High risk medication use   . Hypertension, essential   . LVH (left ventricular hypertrophy)   . Mixed hyperlipidemia   . PAD (peripheral artery disease) ([place])   . Rheumatic fever/heart disease   . Vitamin D deficiency       Past Surgical History:  Procedure Laterality Date  . ABLATION OF DYSRHYTHMIC FOCUS  05/17/2016   atrial flutter  . APPENDECTOMY  1988  . arotic valve replacement  12/2015  . ELECTROPHYSIOLOGIC STUDY N/A 05/17/2016   Procedure: A-Flutter Ablation;  Surgeon: Will Meredith Leeds, MD;  Location: [facility];  Service: Cardiovascular;  Laterality: N/A;  . TENDON REPAIR  1986      Social History:     Social History  Substance Use Topics  . Smoking status: Current Every Day Smoker    Packs/day: 1.00    Years: 30.00    Types: Cigarettes  . Smokeless tobacco: Never Used     Comment: uses vapor   . Alcohol use Yes     Comment: 3-4 beers per day         Family History :     Family History  Problem Relation Age of Onset  . Depression Mother   . Diabetes Mellitus II Father        Home Medications:   Prior to Admission medications    Medication Sig Start Date End Date Taking? Authorizing Provider  aspirin 81 MG chewable tablet Chew 81 mg by mouth daily. 01/03/16  Yes [provider]  ELIQUIS 5 MG TABS tablet Take 5 mg by mouth 2 (two) times daily.  04/19/16  Yes [provider]  fluticasone (FLONASE) 50 MCG/ACT nasal spray Place 2 sprays into both nostrils daily.   Yes [provider]  lisinopril (PRINIVIL,ZESTRIL) 20 MG tablet Take 20 mg by mouth daily.    Yes [provider]  metoprolol (LOPRESSOR) 100 MG tablet Take 100 mg by mouth 2 (two) times daily.  01/03/16  Yes [provider]  pravastatin (PRAVACHOL) 20 MG tablet Take 20 mg by mouth daily.    Yes [provider]     Allergies:    No Known Allergies   Physical Exam:   Vitals  Blood pressure 103/69, pulse (!) 128, temperature 98.9 F (37.2 C), temperature source Oral, resp. rate 12, SpO2 93 %.   1. General Middle-aged white male lying in bed in NAD,  appears anxious and has fine tremors,  2. Normal affect and insight, Not Suicidal or Homicidal, Awake Alert, Oriented X 3.  3. No F.N deficits, ALL C.Nerves Intact, Strength 5/5 all 4 extremities, Sensation intact all 4 extremities, Plantars down going.  4. Ears and Eyes appear Normal, Conjunctivae clear, PERRLA. Moist Oral Mucosa.  5. Supple Neck, No JVD, No cervical lymphadenopathy appriciated, No Carotid Bruits.  6. Symmetrical Chest wall movement, Good air movement bilaterally, CTAB.  7. RRR, No Gallops, Rubs or Murmurs, No Parasternal Heave.  8. Positive Bowel Sounds, Abdomen Soft, No tenderness, No organomegaly appriciated,No rebound -guarding or rigidity.  9.  No Cyanosis, Normal Skin Turgor, No Skin Rash or Bruise.  10. Good muscle tone,  joints appear normal , no effusions, Normal ROM. Tender to palpate  right-sided hip joint, right leg is internally rotated and slightly shortened  11. No Palpable Lymph Nodes in Neck or Axillae       Data Review:    CBC  Recent Labs Lab 01/27/17 1437  WBC 11.6*  HGB 9.0*  HCT 23.9*  PLT 75*  MCV 98.0  MCH 36.9*  MCHC 37.7*  RDW 13.1  LYMPHSABS 1.2  MONOABS 1.9*  EOSABS 0.0  BASOSABS 0.0   ------------------------------------------------------------------------------------------------------------------  Chemistries   Recent Labs Lab 01/27/17 1437  NA 128*  K 4.9  CL 97*  CO2 16*  GLUCOSE 99  BUN 9  CREATININE 0.87  CALCIUM 8.3*   ------------------------------------------------------------------------------------------------------------------ CrCl cannot be calculated (Unknown ideal weight.). ------------------------------------------------------------------------------------------------------------------ No results for input(s): TSH, T4TOTAL, T3FREE, THYROIDAB in the last 72 hours.  Invalid input(s): FREET3  Coagulation profile  Recent Labs Lab 01/27/17 1437  INR 1.12   ------------------------------------------------------------------------------------------------------------------- No results for input(s): DDIMER in the last 72 hours. -------------------------------------------------------------------------------------------------------------------  Cardiac Enzymes No results for input(s): CKMB, TROPONINI, MYOGLOBIN in the last 168 hours.  Invalid input(s): CK ------------------------------------------------------------------------------------------------------------------ No results found for: BNP   ---------------------------------------------------------------------------------------------------------------  Urinalysis No results found for: COLORURINE, APPEARANCEUR, [place], [place], [place], [place], BILIRUBINUR, KETONESUR, PROTEINUR, UROBILINOGEN, NITRITE, LEUKOCYTESUR  ----------------------------------------------------------------------------------------------------------------   Imaging Results:    Dg Pelvis 1-2 Views  Result  Date: 01/27/2017 CLINICAL DATA:  Fall this morning down 15 stairs with right hip pain. EXAM: PELVIS - 1-2 VIEW COMPARISON:  None. FINDINGS: There is a displaced intertrochanteric fracture of the right hip. Mild exaggeration coxa vera deformity over the fracture site. Remainder of the exam is within normal. IMPRESSION: Displaced intertrochanteric right hip fracture. Electronically Signed   By: Marin Olp M.D.   On: 01/27/2017 15:19   Ct Head Wo Contrast  Result Date: 01/27/2017 CLINICAL DATA:  Fall earlier today downstairs, headache, on anticoagulation EXAM: CT HEAD WITHOUT CONTRAST TECHNIQUE: Contiguous axial images were obtained from the base of the skull through the vertex without intravenous contrast. COMPARISON:  None. FINDINGS: Brain: Mild brain atrophy and chronic white matter microvascular ischemic changes about the ventricles. No acute intracranial hemorrhage, mass lesion, definite infarction midline shift, herniation, hydrocephalus, or extra-axial fluid collection. Bilateral basal ganglia calcifications noted. Cisterns remain patent. Cerebellar atrophy as well. Vascular: No hyperdense vessel or unexpected calcification. Skull: Normal. Negative for fracture or focal lesion. Sinuses/Orbits: No acute finding. Other: None. IMPRESSION: Brain atrophy and chronic white matter microvascular ischemic changes. No acute intracranial abnormality by noncontrast CT. Bilateral basal ganglia calcifications Electronically Signed   By: Jerilynn Mages.  Shick M.D.   On: 01/27/2017 15:07   Dg Chest Port 1 View  Result Date: 01/27/2017 CLINICAL DATA:  Fall this morning down 15 stairs with right hip pain. EXAM: PORTABLE CHEST 1 VIEW COMPARISON:  None. FINDINGS: Lungs are adequately inflated without consolidation or effusion. No pneumothorax. Cardiomediastinal silhouette is within normal. There is mild calcified plaque over the aortic arch. Median sternotomy wires and prosthetic heart valve are present. Old left posterolateral  fifth rib fracture. IMPRESSION: No acute findings. Aortic atherosclerosis. Electronically Signed   By: Marin Olp M.D.   On: 01/27/2017 15:21   Dg Femur, Min 2 Views Right  Result Date: 01/27/2017 CLINICAL DATA:  Fall this morning down 15 stairs with right hip pain. EXAM: RIGHT FEMUR 2 VIEWS COMPARISON:  None. FINDINGS: Examination demonstrates a displaced intertrochanteric fracture of the right hip. Remainder of the exam is unremarkable. IMPRESSION: Displaced intertrochanteric right hip fracture. Electronically Signed   By: Marin Olp M.D.  On: 01/27/2017 15:18    My personal review of EKG: Rhythm sinus tachycardia with rate 140 , no Acute ST changes   Assessment & Plan:     1. Mechanical fall with right hip intertrochanteric fracture. Discussed his case with orthopedic physician Dr. Rolena Infante, nothing by mouth after midnight, pain control, surgery in the morning.  2. Severe alcohol and nicotine abuse. Already in DTs, place on scheduled Librium along with CIWA protocol, monitor his stepdown. Counseled to quit.  3. Paroxysmal atrial flutter, [place] vasc 2 score of at least 2, status post bovine aortic valve replacement surgery and history of rheumatic heart disease. Currently blood pressure is low, he is in sinus but few runs of a flutter, place him on amiodarone along with IV fluid bolus, discussed with cardiology will see in the morning, for now per cardiology hold anticoagulation until operated thereafter non-bolus heparin.  4. Thrombocytopenia. 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 Monitor. Hold aspirin and anticoagulation till surgery.  5. Hypertension . Currently blood pressure low, hold blood pressure medications tonight, as needed IV Lopressor, hydrate.  6. PAD. Continue statin for PAD secondary prevention, aspirin and Eliquis held for #1 above.  7. Hyponatremia - likely beer induced, check serum Osm, ur lytes, for now since Hypotensive will have to give NS, BMP in am.  8.  Anemia - check panel (b12- folate) , type screen, expect some dilutional fall.    DVT Prophylaxis SCDs   AM Labs Ordered, also please review Full Orders  Family Communication: Admission, patients condition and plan of care including tests being ordered have been discussed with the patient  who indicates understanding and agree with the plan and Code Status.  Code Status Full  Likely DC to  TBD  Condition GUARDED    Consults called: Cards, ortho    Admission status: Inpt    Time spent in minutes : 35   Lala Lund M.D on 01/27/2017 at 4:39 PM  Between 7am to 7pm - Pager - [phone] ( page via [facility], text pages only, please mention full 10 digit call back number).  After 7pm go to [email or web address] - password [facility]  [facility] - Office  [phone]

## 2017-01-27 NOTE — ED Triage Notes (Addendum)
Patient here from home with complaints of fall at 0730am this morning down 15 stairs. Complaints of right leg and hip pain. 232mcg Fent given in route. On Eliquis.

## 2017-01-27 NOTE — H&P (Signed)
Patient ID: Malik Lambert MRN: [id number] DOB/AGE: Nov 20, 1965 51 y.o.  Admit date: 01/27/2017  Admission Diagnoses:  Principal Problem:   Displaced intertrochanteric fracture of right femur, initial encounter for closed fracture [facility]) Active Problems:   Atrial flutter ([place])   Aortic stenosis   Hypertension, essential   Mixed hyperlipidemia   PAD (peripheral artery disease) (HCC)   GERD (gastroesophageal reflux disease)   Alcohol abuse   Closed displaced intertrochanteric fracture of right femur (HCC)   HPI: Pleasant 51 year old male pt with a past med hx of Rhuematic heart disease and afib.  The pt has Aortic Valve replacement in May of 2017.  He continues to have severe aortic valve stenosis.  He is on chronic Eliquis.  He reports waking up this morning and missing a step and falling down a flight of stairs.  Pt reports some bumps and bruises but no other severe pain post trama.  Pt reports his pain is 8/10.   Past Medical History: Past Medical History:  Diagnosis Date  . Acid reflux   . Alcohol abuse   . Anxiety disorder   . Aortic stenosis   . Aortic valve defect   . Ascending aortic aneurysm ([place])   . Atrial flutter ([place])   . Chronic constipation   . Dyspnea   . GERD (gastroesophageal reflux disease)   . Heart murmur   . Heart murmur   . High risk medication use   . Hypertension, essential   . LVH (left ventricular hypertrophy)   . Mixed hyperlipidemia   . PAD (peripheral artery disease) ([place])   . Rheumatic fever/heart disease   . Vitamin D deficiency     Surgical History: Past Surgical History:  Procedure Laterality Date  . ABLATION OF DYSRHYTHMIC FOCUS  05/17/2016   atrial flutter  . APPENDECTOMY  1988  . arotic valve replacement  12/2015  . ELECTROPHYSIOLOGIC STUDY N/A 05/17/2016   Procedure: A-Flutter Ablation;  Surgeon: Will Meredith Leeds, MD;  Location: [place] [facility];  Service: Cardiovascular;  Laterality: N/A;  . TENDON REPAIR  1986     Family History: Family History  Problem Relation Age of Onset  . Depression Mother   . Diabetes Mellitus II Father     Social History: Social History   Social History  . Marital status: Single    Spouse name: N/A  . Number of children: N/A  . Years of education: N/A   Occupational History  . Not on file.   Social History Main Topics  . Smoking status: Current Every Day Smoker    Packs/day: 1.00    Years: 30.00    Types: Cigarettes  . Smokeless tobacco: Never Used     Comment: uses vapor   . Alcohol use Yes     Comment: 3-4 beers per day  . Drug use: No  . Sexual activity: Not on file   Other Topics Concern  . Not on file   Social History Narrative  . No narrative on file    Allergies: Patient has no known allergies.  Medications: I have reviewed the patient's current medications.  Vital Signs: Patient Vitals for the past 24 hrs:  BP Temp Temp src Pulse Resp SpO2 Height Weight  01/27/17 1722 110/81 - - (!) 112 10 98 % - -  01/27/17 1715 101/68 - - (!) 121 15 95 % - -  01/27/17 1707 - - - - - - 5\' 6"  (1.676 m) 68.9 kg (152 lb)  01/27/17  1700 (!) 84/55 - - (!) 125 10 96 % - -  01/27/17 1638 103/69 - - (!) 128 12 93 % - -  01/27/17 1543 98/62 - - (!) 130 10 95 % - -  01/27/17 1334 104/74 98.9 F (37.2 C) Oral (!) 138 13 99 % - -    Radiology: Dg Pelvis 1-2 Views  Result Date: 01/27/2017 CLINICAL DATA:  Fall this morning down 15 stairs with right hip pain. EXAM: PELVIS - 1-2 VIEW COMPARISON:  None. FINDINGS: There is a displaced intertrochanteric fracture of the right hip. Mild exaggeration coxa vera deformity over the fracture site. Remainder of the exam is within normal. IMPRESSION: Displaced intertrochanteric right hip fracture. Electronically Signed   By: Marin Olp M.D.   On: 01/27/2017 15:19   Ct Head Wo Contrast  Result Date: 01/27/2017 CLINICAL DATA:  Fall earlier today downstairs, headache, on anticoagulation EXAM: CT HEAD WITHOUT CONTRAST  TECHNIQUE: Contiguous axial images were obtained from the base of the skull through the vertex without intravenous contrast. COMPARISON:  None. FINDINGS: Brain: Mild brain atrophy and chronic white matter microvascular ischemic changes about the ventricles. No acute intracranial hemorrhage, mass lesion, definite infarction midline shift, herniation, hydrocephalus, or extra-axial fluid collection. Bilateral basal ganglia calcifications noted. Cisterns remain patent. Cerebellar atrophy as well. Vascular: No hyperdense vessel or unexpected calcification. Skull: Normal. Negative for fracture or focal lesion. Sinuses/Orbits: No acute finding. Other: None. IMPRESSION: Brain atrophy and chronic white matter microvascular ischemic changes. No acute intracranial abnormality by noncontrast CT. Bilateral basal ganglia calcifications Electronically Signed   By: Jerilynn Mages.  Shick M.D.   On: 01/27/2017 15:07   Dg Chest Port 1 View  Result Date: 01/27/2017 CLINICAL DATA:  Fall this morning down 15 stairs with right hip pain. EXAM: PORTABLE CHEST 1 VIEW COMPARISON:  None. FINDINGS: Lungs are adequately inflated without consolidation or effusion. No pneumothorax. Cardiomediastinal silhouette is within normal. There is mild calcified plaque over the aortic arch. Median sternotomy wires and prosthetic heart valve are present. Old left posterolateral fifth rib fracture. IMPRESSION: No acute findings. Aortic atherosclerosis. Electronically Signed   By: Marin Olp M.D.   On: 01/27/2017 15:21   Dg Femur, Min 2 Views Right  Result Date: 01/27/2017 CLINICAL DATA:  Fall this morning down 15 stairs with right hip pain. EXAM: RIGHT FEMUR 2 VIEWS COMPARISON:  None. FINDINGS: Examination demonstrates a displaced intertrochanteric fracture of the right hip. Remainder of the exam is unremarkable. IMPRESSION: Displaced intertrochanteric right hip fracture. Electronically Signed   By: Marin Olp M.D.   On: 01/27/2017 15:18     Labs:  Recent Labs  01/27/17 1437  WBC 11.6*  RBC 2.44*  HCT 23.9*  PLT 75*    Recent Labs  01/27/17 1437  NA 128*  K 4.9  CL 97*  CO2 16*  BUN 9  CREATININE 0.87  GLUCOSE 99  CALCIUM 8.3*    Recent Labs  01/27/17 1437  INR 1.12    Review of Systems: ROS  Physical Exam: Neurologically intact ABD soft Sensation intact distally Compartment soft  Right leg is externally rotated  TTP of the right hip Pt can move right foot  Assessment and Plan: Right hip intertrochanteric fracture on imaging. Consulted with Dr. Rolena Infante Admit to floor Pt will need to be NPO after midnight Surgical intervention pending tomorrow   Ronette Deter, [facility] for Melina Schools, MD [place] [phone]

## 2017-01-27 NOTE — ED Notes (Signed)
Patient transported to X-ray 

## 2017-01-27 NOTE — ED Provider Notes (Signed)
[facility] DEPT Provider Note   CSN: [id number] Arrival date & time: 01/27/17  1319     History   Chief Complaint Chief Complaint  Patient presents with  . Fall  . Hip Pain    HPI Malik Lambert is a 51 y.o. male.  HPI Patient presents to the emergency room for evaluation of right hip and thigh pain. Patient was at his home when he went to walk down the stairs about 7:30 in the morning. Patient accidentally stumbled and fell down 15 stairs. Patient had a significant amount of pain in his thigh. He initially thought it might have just been badly bruised so he crawled to his couch and went to rest to see if the symptoms would resolve. When he woke up the symptoms were more severe. He is having more swelling in his thigh. He is unable to bear any weight. EMS was called. He was given 250 g of fentanyl and brought into the emergency room. She is on anticoagulants but denies any headache. No chest pain. No shortness of breath. Past Medical History:  Diagnosis Date  . Acid reflux   . Alcohol abuse   . Anxiety disorder   . Aortic stenosis   . Aortic valve defect   . Ascending aortic aneurysm ([place])   . Atrial flutter ([place])   . Chronic constipation   . Dyspnea   . GERD (gastroesophageal reflux disease)   . Heart murmur   . Heart murmur   . High risk medication use   . Hypertension, essential   . LVH (left ventricular hypertrophy)   . Mixed hyperlipidemia   . PAD (peripheral artery disease) ([place])   . Rheumatic fever/heart disease   . Vitamin D deficiency     Patient Active Problem List   Diagnosis Date Noted  . Displaced intertrochanteric fracture of right femur, initial encounter for closed fracture ([place]) 01/27/2017  . Aortic stenosis   . Hypertension, essential   . Mixed hyperlipidemia   . PAD (peripheral artery disease) ([place])   . GERD (gastroesophageal reflux disease)   . Alcohol abuse   . Atrial flutter ([place]) 05/17/2016    Past Surgical History:  Procedure  Laterality Date  . ABLATION OF DYSRHYTHMIC FOCUS  05/17/2016   atrial flutter  . APPENDECTOMY  1988  . arotic valve replacement  12/2015  . ELECTROPHYSIOLOGIC STUDY N/A 05/17/2016   Procedure: A-Flutter Ablation;  Surgeon: Will Meredith Leeds, MD;  Location: [place] [facility];  Service: Cardiovascular;  Laterality: N/A;  . [place] Medications    Prior to Admission medications   Medication Sig Start Date End Date Taking? Authorizing Provider  aspirin 81 MG chewable tablet Chew 81 mg by mouth daily. 01/03/16  Yes [provider]  ELIQUIS 5 MG TABS tablet Take 5 mg by mouth 2 (two) times daily.  04/19/16  Yes [provider]  fluticasone (FLONASE) 50 MCG/ACT nasal spray Place 2 sprays into both nostrils daily.   Yes [provider]  lisinopril (PRINIVIL,ZESTRIL) 20 MG tablet Take 20 mg by mouth daily.    Yes [provider]  metoprolol (LOPRESSOR) 100 MG tablet Take 100 mg by mouth 2 (two) times daily.  01/03/16  Yes [provider]  pravastatin (PRAVACHOL) 20 MG tablet Take 20 mg by mouth daily.    Yes [provider]    Family History Family History  Problem Relation Age of Onset  . Depression Mother   .  Diabetes Mellitus II Father     Social History Social History  Substance Use Topics  . Smoking status: Current Every Day Smoker    Packs/day: 1.00    Years: 30.00    Types: Cigarettes  . Smokeless tobacco: Never Used     Comment: uses vapor   . Alcohol use Yes     Comment: 3-4 beers per day     Allergies   Patient has no known allergies.   Review of Systems Review of Systems   Physical Exam Updated Vital Signs BP 98/62 (BP Location: Right Arm)   Pulse (!) 130   Temp 98.9 F (37.2 C) (Oral)   Resp 10   SpO2 95%   Physical Exam  Constitutional: He appears distressed.  HENT:  Head: Normocephalic and atraumatic.  Right Ear: External ear normal.  Left Ear: External ear normal.    Eyes: Conjunctivae are normal. Right eye exhibits no discharge. Left eye exhibits no discharge. No scleral icterus.  Neck: Neck supple. No tracheal deviation present.  Cardiovascular: Normal rate, regular rhythm and intact distal pulses.   Pulmonary/Chest: Effort normal and breath sounds normal. No stridor. No respiratory distress. He has no wheezes. He has no rales.  Abdominal: Soft. Bowel sounds are normal. He exhibits no distension. There is no tenderness. There is no rebound and no guarding.  Musculoskeletal:       Right hip: He exhibits tenderness.       Cervical back: Normal.       Thoracic back: Normal.       Lumbar back: Normal.       Right upper leg: He exhibits tenderness, bony tenderness, swelling and edema.  Right lower extremity shortened compared to the left, patient's unable to lift his right leg, significant edema of the right thigh  Neurological: He is alert. He has normal strength. No cranial nerve deficit (no facial droop, extraocular movements intact, no slurred speech) or sensory deficit. He exhibits normal muscle tone. He displays no seizure activity. Coordination normal.  Skin: Skin is warm and dry. No rash noted. He is not diaphoretic.  Psychiatric: He has a normal mood and affect.  Nursing note and vitals reviewed.    ED Treatments / Results  Labs (all labs ordered are listed, but only abnormal results are displayed) Labs Reviewed  BASIC METABOLIC PANEL - Abnormal; Notable for the following:       Result Value   Sodium 128 (*)    Chloride 97 (*)    CO2 16 (*)    Calcium 8.3 (*)    All other components within normal limits  PROTIME-INR  CBC WITH DIFFERENTIAL/PLATELET  OSMOLALITY  OSMOLALITY, URINE  SODIUM, URINE, RANDOM  URIC ACID  TYPE AND SCREEN  ABO/RH    EKG  EKG Interpretation  Date/Time:  Saturday January 27 2017 13:33:47 EDT Ventricular Rate:  139 PR Interval:    QRS Duration: 88 QT Interval:  281 QTC Calculation: 428 R Axis:   50 Text  Interpretation:  Sinus tachycardia Left atrial enlargement Anterior infarct, old Nonspecific T abnormalities, lateral leads No old tracing to compare Confirmed by Dorie Rank [phone]) on 01/27/2017 1:36:47 PM       Radiology Dg Pelvis 1-2 Views  Result Date: 01/27/2017 CLINICAL DATA:  Fall this morning down 15 stairs with right hip pain. EXAM: PELVIS - 1-2 VIEW COMPARISON:  [facility]. FINDINGS: There is a displaced intertrochanteric fracture of the right hip. Mild exaggeration coxa vera deformity over the fracture site. Remainder  of the exam is within normal. IMPRESSION: Displaced intertrochanteric right hip fracture. Electronically Signed   By: [place] Olp M.D.   On: 01/27/2017 15:19   Ct Head Wo Contrast  Result Date: 01/27/2017 CLINICAL DATA:  Fall earlier today downstairs, headache, on anticoagulation EXAM: CT HEAD WITHOUT CONTRAST TECHNIQUE: Contiguous axial images were obtained from the base of the skull through the vertex without intravenous contrast. COMPARISON:  Lambert. FINDINGS: Brain: Mild brain atrophy and chronic white matter microvascular ischemic changes about the ventricles. No acute intracranial hemorrhage, mass lesion, definite infarction midline shift, herniation, hydrocephalus, or extra-axial fluid collection. Bilateral basal ganglia calcifications noted. Cisterns remain patent. Cerebellar atrophy as well. Vascular: No hyperdense vessel or unexpected calcification. Skull: Normal. Negative for fracture or focal lesion. Sinuses/Orbits: No acute finding. Other: Lambert. IMPRESSION: Brain atrophy and chronic white matter microvascular ischemic changes. No acute intracranial abnormality by noncontrast CT. Bilateral basal ganglia calcifications Electronically Signed   By: Jerilynn Mages.  Shick M.D.   On: 01/27/2017 15:07   Dg Chest Port 1 View  Result Date: 01/27/2017 CLINICAL DATA:  Fall this morning down 15 stairs with right hip pain. EXAM: PORTABLE CHEST 1 VIEW COMPARISON:  Lambert. FINDINGS: Lungs are  adequately inflated without consolidation or effusion. No pneumothorax. Cardiomediastinal silhouette is within normal. There is mild calcified plaque over the aortic arch. Median sternotomy wires and prosthetic heart valve are present. Old left posterolateral fifth rib fracture. IMPRESSION: No acute findings. Aortic atherosclerosis. Electronically Signed   By: [place] Olp M.D.   On: 01/27/2017 15:21   Dg Femur, Min 2 Views Right  Result Date: 01/27/2017 CLINICAL DATA:  Fall this morning down 15 stairs with right hip pain. EXAM: RIGHT FEMUR 2 VIEWS COMPARISON:  [facility]bert. FINDINGS: Examination demonstrates a displaced intertrochanteric fracture of the right hip. Remainder of the exam is unremarkable. IMPRESSION: Displaced intertrochanteric right hip fracture. Electronically Signed   By: [place] Olp M.D.   On: 01/27/2017 15:18    Procedures Procedures (including critical care time)  Medications Ordered in ED Medications  0.9 %  sodium chloride infusion ( Intravenous New Bag/Given 01/27/17 1438)  sodium chloride 0.9 % bolus 1,000 mL (not administered)    Followed by  0.9 %  sodium chloride infusion (not administered)  LORazepam (ATIVAN) injection 1 mg (not administered)  diltiazem (CARDIZEM) 1 mg/mL load via infusion 10 mg (not administered)    And  diltiazem (CARDIZEM) 100 mg in dextrose 5% 166mL (1 mg/mL) infusion (not administered)  HYDROmorphone (DILAUDID) injection 1 mg (1 mg Intravenous Given 01/27/17 1541)     Initial Impression / Assessment and Plan / ED Course  I have reviewed the triage vital signs and the nursing notes.  Pertinent labs & imaging results that were available during my care of the patient were reviewed by me and considered in my medical decision making (see chart for details).  Clinical Course as of Jan 27 1617  Sat Jan 27, 2017  1438 Sx concerning for possible femur fx, hip fx.  Will CT head to rule out intracranial bleed as he fell down a flight of stairs,  likely  will need surgery and he is on anticoagulants.  [WC]  5852 Patient's x-rays demonstrate an intertrochanteric hip fracture.  [DP]  8242 I will consult with orthopedics. The patient will require admission to the hospital  [JK]  1541 D/w Dr Rolena Infante.  No surgery today.  Hold eliquis. NPO after midnight.  [JK]  1602 HGB 9, platelet 75, WBC 11.6  [JK]  Clinical Course User Index [JK] Dorie Rank, MD   I reviewed [facility] from [facility].  Pt has history of alcohol abuse.  There is also a history of medical noncompliance.  I am concerned about his persistent tachycardia.  Hgb may of 2017 at [facility] was 8.7.  Today is not significantly changed.  The tachycardia may be from a combination of the pain and possibly a component of alcohol withdrawal.    Will admit for further treatment.  Hydrate with fluids and give a dose of benzodiazepine  Final Clinical Impressions(s) / ED Diagnoses   Final diagnoses:  Closed displaced intertrochanteric fracture of right femur, initial encounter ([place])      Dorie Rank, MD 01/27/17 [phone]

## 2017-01-28 ENCOUNTER — Inpatient Hospital Stay (HOSPITAL_COMMUNITY): Payer: BLUE CROSS/BLUE SHIELD

## 2017-01-28 ENCOUNTER — Inpatient Hospital Stay (HOSPITAL_COMMUNITY): Payer: BLUE CROSS/BLUE SHIELD | Admitting: Anesthesiology

## 2017-01-28 DIAGNOSIS — Z01810 Encounter for preprocedural cardiovascular examination: Secondary | ICD-10-CM

## 2017-01-28 DIAGNOSIS — F1010 Alcohol abuse, uncomplicated: Secondary | ICD-10-CM

## 2017-01-28 DIAGNOSIS — I10 Essential (primary) hypertension: Secondary | ICD-10-CM

## 2017-01-28 DIAGNOSIS — K219 Gastro-esophageal reflux disease without esophagitis: Secondary | ICD-10-CM

## 2017-01-28 DIAGNOSIS — I4892 Unspecified atrial flutter: Secondary | ICD-10-CM

## 2017-01-28 DIAGNOSIS — S72141A Displaced intertrochanteric fracture of right femur, initial encounter for closed fracture: Principal | ICD-10-CM

## 2017-01-28 DIAGNOSIS — I739 Peripheral vascular disease, unspecified: Secondary | ICD-10-CM

## 2017-01-28 DIAGNOSIS — E782 Mixed hyperlipidemia: Secondary | ICD-10-CM

## 2017-01-28 LAB — CBC WITH DIFFERENTIAL/PLATELET
BASOS ABS: 0 10*3/uL (ref 0.0–0.1)
BASOS PCT: 0 %
Basophils Absolute: 0 10*3/uL (ref 0.0–0.1)
Basophils Relative: 0 %
EOS ABS: 0.1 10*3/uL (ref 0.0–0.7)
Eosinophils Absolute: 0 10*3/uL (ref 0.0–0.7)
Eosinophils Relative: 0 %
Eosinophils Relative: 1 %
HEMATOCRIT: 18.7 % — AB (ref 39.0–52.0)
HEMATOCRIT: 26.1 % — AB (ref 39.0–52.0)
HEMOGLOBIN: 9.4 g/dL — AB (ref 13.0–17.0)
Hemoglobin: 6.9 g/dL — CL (ref 13.0–17.0)
LYMPHS ABS: 1.2 10*3/uL (ref 0.7–4.0)
LYMPHS PCT: 14 %
Lymphocytes Relative: 19 %
Lymphs Abs: 1.3 10*3/uL (ref 0.7–4.0)
MCH: 36.5 pg — ABNORMAL HIGH (ref 26.0–34.0)
MCH: 33.6 pg (ref 26.0–34.0)
MCHC: 36.9 g/dL — ABNORMAL HIGH (ref 30.0–36.0)
MCHC: 36 g/dL (ref 30.0–36.0)
MCV: 98.9 fL (ref 78.0–100.0)
MCV: 93.2 fL (ref 78.0–100.0)
MONO ABS: 1.2 10*3/uL — AB (ref 0.1–1.0)
Monocytes Absolute: 1.4 10*3/uL — ABNORMAL HIGH (ref 0.1–1.0)
Monocytes Relative: 17 %
Monocytes Relative: 16 %
NEUTROS ABS: 4.4 10*3/uL (ref 1.7–7.7)
NEUTROS ABS: 5.9 10*3/uL (ref 1.7–7.7)
NEUTROS PCT: 64 %
NEUTROS PCT: 69 %
Platelets: 47 10*3/uL — ABNORMAL LOW (ref 150–400)
Platelets: 74 10*3/uL — ABNORMAL LOW (ref 150–400)
RBC: 1.89 MIL/uL — AB (ref 4.22–5.81)
RBC: 2.8 MIL/uL — AB (ref 4.22–5.81)
RDW: 13.3 % (ref 11.5–15.5)
RDW: 15.6 % — ABNORMAL HIGH (ref 11.5–15.5)
WBC: 6.8 10*3/uL (ref 4.0–10.5)
WBC: 8.5 10*3/uL (ref 4.0–10.5)

## 2017-01-28 LAB — BASIC METABOLIC PANEL
ANION GAP: 7 (ref 5–15)
BUN: 8 mg/dL (ref 6–20)
CALCIUM: 7.6 mg/dL — AB (ref 8.9–10.3)
CO2: 21 mmol/L — ABNORMAL LOW (ref 22–32)
Chloride: 102 mmol/L (ref 101–111)
Creatinine, Ser: 0.7 mg/dL (ref 0.61–1.24)
GLUCOSE: 106 mg/dL — AB (ref 65–99)
Potassium: 4.1 mmol/L (ref 3.5–5.1)
SODIUM: 130 mmol/L — AB (ref 135–145)

## 2017-01-28 LAB — OSMOLALITY, URINE: Osmolality, Ur: 258 mOsm/kg — ABNORMAL LOW (ref 300–900)

## 2017-01-28 LAB — HEPATIC FUNCTION PANEL
ALK PHOS: 37 U/L — AB (ref 38–126)
ALT: 26 U/L (ref 17–63)
AST: 52 U/L — AB (ref 15–41)
Albumin: 3 g/dL — ABNORMAL LOW (ref 3.5–5.0)
BILIRUBIN DIRECT: 0.1 mg/dL (ref 0.1–0.5)
BILIRUBIN TOTAL: 0.6 mg/dL (ref 0.3–1.2)
Indirect Bilirubin: 0.5 mg/dL (ref 0.3–0.9)
Total Protein: 5.3 g/dL — ABNORMAL LOW (ref 6.5–8.1)

## 2017-01-28 LAB — PREPARE RBC (CROSSMATCH)

## 2017-01-28 LAB — IRON AND TIBC
Iron: 32 ug/dL — ABNORMAL LOW (ref 45–182)
Saturation Ratios: 17 % — ABNORMAL LOW (ref 17.9–39.5)
TIBC: 190 ug/dL — ABNORMAL LOW (ref 250–450)
UIBC: 158 ug/dL

## 2017-01-28 LAB — HIV ANTIBODY (ROUTINE TESTING W REFLEX): HIV SCREEN 4TH GENERATION: NONREACTIVE

## 2017-01-28 LAB — ABO/RH: ABO/RH(D): O POS

## 2017-01-28 LAB — SODIUM, URINE, RANDOM: SODIUM UR: 33 mmol/L

## 2017-01-28 LAB — FERRITIN: FERRITIN: 1249 ng/mL — AB (ref 24–336)

## 2017-01-28 LAB — VITAMIN B12: Vitamin B-12: 231 pg/mL (ref 180–914)

## 2017-01-28 LAB — FOLATE: Folate: 17 ng/mL (ref >5.9)

## 2017-01-28 LAB — CORTISOL-AM, BLOOD: Cortisol - AM: 14.7 ug/dL (ref 6.7–22.6)

## 2017-01-28 MED ORDER — SODIUM CHLORIDE 0.9 % IV SOLN
Freq: Once | INTRAVENOUS | Status: AC
  Administered 2017-01-28: 09:00:00 via INTRAVENOUS

## 2017-01-28 MED ORDER — CHLORDIAZEPOXIDE HCL 5 MG PO CAPS
15.0000 mg | ORAL_CAPSULE | Freq: Three times a day (TID) | ORAL | Status: AC
  Administered 2017-01-28 (×2): 15 mg via ORAL
  Filled 2017-01-28 (×2): qty 1

## 2017-01-28 MED ORDER — SODIUM CHLORIDE 0.9 % IV SOLN
Freq: Once | INTRAVENOUS | Status: AC
  Administered 2017-01-28: 12:00:00 via INTRAVENOUS

## 2017-01-28 MED ORDER — FENTANYL CITRATE (PF) 100 MCG/2ML IJ SOLN
50.0000 ug | INTRAMUSCULAR | Status: DC | PRN
  Administered 2017-01-28 – 2017-02-02 (×21): 50 ug via INTRAVENOUS
  Filled 2017-01-28 (×21): qty 2

## 2017-01-28 MED ORDER — METOPROLOL TARTRATE 50 MG PO TABS
50.0000 mg | ORAL_TABLET | Freq: Two times a day (BID) | ORAL | Status: DC
  Administered 2017-01-28 – 2017-02-01 (×6): 50 mg via ORAL
  Filled 2017-01-28 (×3): qty 2
  Filled 2017-01-28: qty 1
  Filled 2017-01-28 (×3): qty 2
  Filled 2017-01-28: qty 1

## 2017-01-28 MED ORDER — CHLORDIAZEPOXIDE HCL 5 MG PO CAPS
15.0000 mg | ORAL_CAPSULE | Freq: Two times a day (BID) | ORAL | Status: AC
  Administered 2017-01-29 – 2017-01-30 (×2): 15 mg via ORAL
  Filled 2017-01-28 (×2): qty 1

## 2017-01-28 MED ORDER — FENTANYL CITRATE (PF) 100 MCG/2ML IJ SOLN
25.0000 ug | INTRAMUSCULAR | Status: DC | PRN
  Administered 2017-01-28 (×2): 25 ug via INTRAVENOUS
  Filled 2017-01-28 (×2): qty 2

## 2017-01-28 MED ORDER — LIP MEDEX EX OINT
TOPICAL_OINTMENT | CUTANEOUS | Status: AC
  Administered 2017-01-28: 06:00:00
  Filled 2017-01-28: qty 7

## 2017-01-28 NOTE — Progress Notes (Signed)
CRITICAL VALUE ALERT  Critical Value:hgb 6.9  Date & Time Notied: 0425  Provider Notified: ortiz  Orders Received/Actions taken: yes

## 2017-01-28 NOTE — Progress Notes (Addendum)
Subjective: Day of Surgery Procedure(s) (LRB): INTRAMEDULLARY (IM) NAIL INTERTROCHANTRIC (Right) Patient reports pain as 4 on 0-10 scale.    Objective: Vital signs in last 24 hours: Temp:  [98.1 F (36.7 C)-98.9 F (37.2 C)] 98.1 F (36.7 C) (06/24 0502) Pulse Rate:  [110-138] 128 (06/23 1828) Resp:  [10-27] 27 (06/24 0700) BP: (76-112)/(48-81) 100/59 (06/24 0700) SpO2:  [91 %-100 %] 95 % (06/24 0700) Weight:  [68.9 kg (152 lb)-70.5 kg (155 lb 6.8 oz)] 70.5 kg (155 lb 6.8 oz) (06/24 0502)  Intake/Output from previous day: 06/23 0701 - 06/24 0700 In: 2636.7 [I.V.:1636.7; IV Piggyback:1000] Out: 1350 [Urine:1350] Intake/Output this shift: No intake/output data recorded.   Recent Labs  01/27/17 1437 01/27/17 1928 01/28/17 0345  HGB 9.0* 7.5* 6.9*    Recent Labs  01/27/17 1437 01/27/17 1735 01/27/17 1928 01/28/17 0345  WBC 11.6*  --   --  6.8  RBC 2.44* 2.20*  --  1.89*  HCT 23.9*  --  20.1* 18.7*  PLT 75*  --   --  47*    Recent Labs  01/27/17 1437 01/28/17 0345  NA 128* 130*  K 4.9 4.1  CL 97* 102  CO2 16* 21*  BUN 9 8  CREATININE 0.87 0.70  GLUCOSE 99 106*  CALCIUM 8.3* 7.6*    Recent Labs  01/27/17 1437  INR 1.12    Neurologically intact Intact pulses distally Dorsiflexion/Plantar flexion intact No cellulitis present Compartment soft  Assessment/Plan: Day of Surgery Procedure(s) (LRB): INTRAMEDULLARY (IM) NAIL INTERTROCHANTRIC (Right)  Low PLT, HGB, Ca will preclude ORIF today. May need PLT transfusion Ca etc Also last took Eliquis Friday AM. Spoke with Medicine Discussed with patient   Ayah Cozzolino C 01/28/2017, 8:15 AM

## 2017-01-28 NOTE — Anesthesia Preprocedure Evaluation (Deleted)
Anesthesia Evaluation  Patient identified by MRN, date of birth, ID band Patient awake    Reviewed: Allergy & Precautions, NPO status , Patient's Chart, lab work & pertinent test results, reviewed documented beta blocker date and time   Airway Mallampati: II  TM Distance: >3 FB Neck ROM: Full    Dental no notable dental hx.    Pulmonary neg pulmonary ROS, Current Smoker,    Pulmonary exam normal breath sounds clear to auscultation       Cardiovascular hypertension, + Peripheral Vascular Disease  negative cardio ROS Normal cardiovascular exam Rhythm:Regular Rate:Normal     Neuro/Psych Alcohol abusenegative neurological ROS     GI/Hepatic negative GI ROS, Neg liver ROS, GERD  ,  Endo/Other  negative endocrine ROS  Renal/GU negative Renal ROS  negative genitourinary   Musculoskeletal negative musculoskeletal ROS (+)   Abdominal   Peds negative pediatric ROS (+)  Hematology negative hematology ROS (+) anemia ,   Anesthesia Other Findings   Reproductive/Obstetrics negative OB ROS                             Anesthesia Physical Anesthesia Plan  ASA: III and emergent  Anesthesia Plan: General   Post-op Pain Management:    Induction: Intravenous  PONV Risk Score and Plan: 2 and Ondansetron, Dexamethasone and Treatment may vary due to age or medical condition  Airway Management Planned: Oral ETT  Additional Equipment:   Intra-op Plan:   Post-operative Plan: Extubation in OR  Informed Consent: I have reviewed the patients History and Physical, chart, labs and discussed the procedure including the risks, benefits and alternatives for the proposed anesthesia with the patient or authorized representative who has indicated his/her understanding and acceptance.   Dental advisory given  Plan Discussed with: CRNA  Anesthesia Plan Comments:         Anesthesia Quick Evaluation

## 2017-01-28 NOTE — Progress Notes (Signed)
PROGRESS NOTE  Malik Lambert  URK:[id number] DOB: 08-10-1965 DOA: 01/27/2017 PCP: Secundino Ginger, PA-C   Brief Narrative: Malik Lambert is a 51 y.o. 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 He displayed evidence of alcohol withdrawal at admission, started on librium and CIWA. Flutter waves initially noted, so amiodarone was started in addition to ~3L IVF's given for hypotension. Cardiology was consulted and the patient continues to be hypotensive, so their recommendation is to discontinue amiodarone with no evidence of flutter since admission and cutting home metoprolol in half. Sinus tachycardia thought to be due to ongoing anemia and pain, so 2u PRBCs and 1u platelets have been ordered in addition to fentanyl IV. He last took eliquis 6/22 PM, so orthopedics is planning open surgery 6/25.   Assessment & Plan: Principal Problem:   Displaced intertrochanteric fracture of right femur, initial encounter for closed fracture [facility]) Active Problems:   Atrial flutter ([place])   Aortic stenosis   Hypertension, essential   Mixed hyperlipidemia   PAD (peripheral artery disease) (HCC)   GERD (gastroesophageal reflux disease)   Alcohol abuse   Closed displaced intertrochanteric fracture of right femur (HCC)  Mechanical fall with right intertrochanteric hip fracture:  - Discussed with orthopedics, plan open surgery per Dr. Tonita Cong tomorrow pending improvement in blood counts and holding anticoagulation (last took eliquis 6/22 PM) - Fentanyl for pain control to minimize hypotensive response - Bedrest, NPO p MN  Alcohol abuse with evidence of withdrawal at admission: Longterm drinking 6-8 beers nightly. Last drink was 6/22 PM.  - Cessation counseling  provided - Librium taper ordered, CIWA SDU protocol - ?Undiagnosed cirrhosis with hyponatremia, thrombocytopenia. Check RUQ U/S.  Paroxysmal atrial flutter s/p ablation: Currently in sinus tachycardia persistently. TSH wnl.  - Appreciate cardiology assistance. Discussed with Dr. Marlou Porch, plan to DC amiodarone and half home dose of metoprolol. Prn beta blocker ordered.  - Holding eliquis and ASA. Consider [place] these with thrombocytopenia, alcohol use, and h/o ablation.   HTN: Currently hypotensive - Monitor on half dose metoprolol and holding lisinopril.  - Check random AM cortisol due to ongoing hypotension.  PAD: No critical ischemia noted - Continue statin, holding ASA/anticoagulation  Normocytic anemia: Iron studies indicate deficiency, and suspect significant hemodilutional component in addition to acute blood loss related to fracture, though compartment is soft.  - Transfuse 2u PRBCs for hgb 6.9 (down from 9 on admission) - Monitor post-transfusion CBC.  - Will give iron supplement during recovery period - Monitor I/O's carefully to avoid overload  Thrombocytopenia: Plt <50k and surgery planned. No evidence of impaired hepatic synthetic function with normal INR, and no schistocytes noted on smear.  - Transfuse platelets and recheck.   Hyponatremia: Mild, improving, suspect potomania, serum osm slightly low at 269. - Continue IVF's and monitor.  DVT prophylaxis: SCDs Code Status: Full Family Communication: None at bedside Disposition Plan: Continue SDU  Consultants:   Orthopedics, Dr. Tonita Cong  Cardiology, Dr. Marlou Porch  Procedures:   None  Antimicrobials:  None   Subjective: Pain improved with morphine but still severe. Denies chest pain or dyspnea.   Objective: Vitals:   01/28/17 0502 01/28/17 0600 01/28/17 0700 01/28/17 0800  BP:  112/77 (!) 100/59 91/63  Pulse:  Resp:  19 (!) 27 (!) 22  Temp: 98.1 F (36.7 C)     TempSrc: Oral     SpO2:  100% 95%     Weight: 70.5 kg (155 lb 6.8 oz)     Height:        Intake/Output Summary (Last 24 hours) at 01/28/17 0827 Last data filed at 01/28/17 2637  Gross per 24 hour  Intake          2636.71 ml  Output             1350 ml  Net          1286.71 ml   Filed Weights   01/27/17 1707 01/28/17 0502  Weight: 68.9 kg (152 lb) 70.5 kg (155 lb 6.8 oz)    Examination: General exam: 51 y.o. male in no distress  Respiratory system: Non-labored breathing room air. Clear to auscultation bilaterally.  Cardiovascular system: Regular rate and rhythm. II/VI early systolic blowing murmur at RUSB, rub, or gallop. No JVD, and no pedal edema. Gastrointestinal system: Abdomen soft, non-tender, non-distended, with normoactive bowel sounds. No organomegaly or masses felt. Central nervous system: Alert and oriented. No focal neurological deficits. Extremities: RLE with tenderness to palpation at hip, ROM limited by pain. Compartments soft, no ecchymoses. SILT and motor function 5/5 in feet. Cap refill brisk throughout.  Skin: No rashes, lesions no ulcers Psychiatry: Judgement and insight appear normal. Mood & affect appropriate.   CBC:  Recent Labs Lab 01/27/17 1437 01/27/17 1928 01/28/17 0345  WBC 11.6*  --  6.8  NEUTROABS 8.5*  --  4.4  HGB 9.0* 7.5* 6.9*  HCT 23.9* 20.1* 18.7*  MCV 98.0  --  98.9  PLT 75*  --  47*   Basic Metabolic Panel:  Recent Labs Lab 01/27/17 1437 01/28/17 0345  NA 128* 130*  K 4.9 4.1  CL 97* 102  CO2 16* 21*  GLUCOSE 99 106*  BUN 9 8  CREATININE 0.87 0.70  CALCIUM 8.3* 7.6*   GFR: Estimated Creatinine Clearance: 97.5 mL/min (by C-G formula based on SCr of 0.7 mg/dL).  Coagulation Profile:  Recent Labs Lab 01/27/17 1437  INR 1.12   Thyroid Function Tests:  Recent Labs  01/27/17 1735  TSH 3.969   Anemia Panel:  Recent Labs  01/27/17 1735  VITAMINB12 231  FOLATE 17.0  FERRITIN 1,249*  TIBC 190*  IRON 32*  RETICCTPCT 2.5   Radiology  Studies: Dg Pelvis 1-2 Views  Result Date: 01/27/2017 CLINICAL DATA:  Fall this morning down 15 stairs with right hip pain. EXAM: PELVIS - 1-2 VIEW COMPARISON:  None. FINDINGS: There is a displaced intertrochanteric fracture of the right hip. Mild exaggeration coxa vera deformity over the fracture site. Remainder of the exam is within normal. IMPRESSION: Displaced intertrochanteric right hip fracture. Electronically Signed   By: Marin Olp M.D.   On: 01/27/2017 15:19   Ct Head Wo Contrast  Result Date: 01/27/2017 CLINICAL DATA:  Fall earlier today downstairs, headache, on anticoagulation EXAM: CT HEAD WITHOUT CONTRAST TECHNIQUE: Contiguous axial images were obtained from the base of the skull through the vertex without intravenous contrast. COMPARISON:  None. FINDINGS: Brain: Mild brain atrophy and chronic white matter microvascular ischemic changes about the ventricles. No acute intracranial hemorrhage, mass lesion, definite infarction midline shift, herniation, hydrocephalus, or extra-axial fluid collection. Bilateral basal ganglia calcifications noted. Cisterns remain patent. Cerebellar atrophy as well. Vascular: No hyperdense vessel or unexpected calcification. Skull: Normal. Negative for fracture or focal lesion.  Sinuses/Orbits: No acute finding. Other: None. IMPRESSION: Brain atrophy and chronic white matter microvascular ischemic changes. No acute intracranial abnormality by noncontrast CT. Bilateral basal ganglia calcifications Electronically Signed   By: Jerilynn Mages.  Shick M.D.   On: 01/27/2017 15:07   Dg Chest Port 1 View  Result Date: 01/27/2017 CLINICAL DATA:  Fall this morning down 15 stairs with right hip pain. EXAM: PORTABLE CHEST 1 VIEW COMPARISON:  None. FINDINGS: Lungs are adequately inflated without consolidation or effusion. No pneumothorax. Cardiomediastinal silhouette is within normal. There is mild calcified plaque over the aortic arch. Median sternotomy wires and prosthetic heart valve  are present. Old left posterolateral fifth rib fracture. IMPRESSION: No acute findings. Aortic atherosclerosis. Electronically Signed   By: Marin Olp M.D.   On: 01/27/2017 15:21   Dg Femur, Min 2 Views Right  Result Date: 01/27/2017 CLINICAL DATA:  Fall this morning down 15 stairs with right hip pain. EXAM: RIGHT FEMUR 2 VIEWS COMPARISON:  None. FINDINGS: Examination demonstrates a displaced intertrochanteric fracture of the right hip. Remainder of the exam is unremarkable. IMPRESSION: Displaced intertrochanteric right hip fracture. Electronically Signed   By: Marin Olp M.D.   On: 01/27/2017 15:18    LOS: 1 day   Time spent: 35 minutes.  Vance Gather, MD [facility] Pager [phone]  If 7PM-7AM, please contact night-coverage [email or web address] Password [facility] 01/28/2017, 8:27 AM

## 2017-01-28 NOTE — Consult Note (Signed)
Cardiology Consultation:   Patient ID: Malik Lambert; [id number]; Dec 07, 1965   Admit date: 01/27/2017 Date of Consult: 01/28/2017  Primary Care Provider: Secundino Ginger, PA-C Primary Cardiologist: Curt Bears    Patient Profile:   Malik Lambert is a 51 y.o. male with a hx of Heavy alcohol use, atrial flutter ablation 2, bioprosthetic aortic valve replacement who is being seen today for the evaluation of atrial flutter at the request of Dr Candiss Norse.  History of Present Illness:   Malik Lambert is a 51 year old male with excessive alcohol use, bioprosthetic aortic valve replacement at [facility], first failed attempt at flutter ablation, then second attempt by Dr. Allegra Lai of our electrophysiology department on 05/17/16 which was successful along the caval tricuspid isthmus with complete bidirectional isthmus block achieved with no inducible arrhythmias following ablation here at [facility] after sustaining a fall and suffering a displaced intertrochanteric right fracture. Platelet count was 75 on admission, currently in the 40s. Hemoglobin on admission was in the 9 range currently in the 6 range after aggressive hydration. He had been prescribed Eliquis 5 mg twice a day as well as aspirin at home (question use of this medication given his excessive alcohol use although he clearly states that his last dose was Friday).   On telemetry, demonstrating sinus tachycardia.  No complaints of chest pain, no shortness of breath. Hip pain noted. Electro physiology notes reviewed.  On metoprolol 100 BID at home.     Past Medical History:  Diagnosis Date  . Acid reflux   . Alcohol abuse   . Anxiety disorder   . Aortic stenosis   . Aortic valve defect   . Ascending aortic aneurysm ([place])   . Atrial flutter ([place])   . Chronic constipation   . Dyspnea   . GERD (gastroesophageal reflux disease)   . Heart murmur   . Heart murmur   . High risk medication use   . Hypertension, essential   .  LVH (left ventricular hypertrophy)   . Mixed hyperlipidemia   . PAD (peripheral artery disease) ([place])   . Rheumatic fever/heart disease   . Vitamin D deficiency     Past Surgical History:  Procedure Laterality Date  . ABLATION OF DYSRHYTHMIC FOCUS  05/17/2016   atrial flutter  . APPENDECTOMY  1988  . arotic valve replacement  12/2015  . ELECTROPHYSIOLOGIC STUDY N/A 05/17/2016   Procedure: A-Flutter Ablation;  Surgeon: Will Meredith Leeds, MD;  Location: [facility];  Service: Cardiovascular;  Laterality: N/A;  . TENDON REPAIR  1986     Inpatient Medications: Scheduled Meds: . chlordiazePOXIDE  15 mg Oral TID  . fluticasone  2 spray Each Nare Daily  . folic acid  1 mg Oral Daily  . LORazepam  0-4 mg Intravenous Q6H   Followed by  . [START ON 01/29/2017] LORazepam  0-4 mg Intravenous Q12H  . metoprolol tartrate  100 mg Oral BID  . multivitamin with minerals  1 tablet Oral Daily  . pravastatin  20 mg Oral QHS  . thiamine  100 mg Oral Daily   Or  . thiamine  100 mg Intravenous Daily   Continuous Infusions: . sodium chloride 100 mL/hr at 01/28/17 0612  . sodium chloride    . amiodarone 30 mg/hr (01/27/17 2300)   PRN Meds: albuterol, HYDROcodone-acetaminophen, LORazepam **OR** LORazepam, metoprolol tartrate, morphine injection, ondansetron (ZOFRAN) IV  Allergies:   No Known Allergies  Social History:   Social History   Social History  .  Marital status: Single    Spouse name: N/A  . Number of children: N/A  . Years of education: N/A   Occupational History  . Not on file.   Social History Main Topics  . Smoking status: Current Every Day Smoker    Packs/day: 1.00    Years: 30.00    Types: Cigarettes  . Smokeless tobacco: Never Used     Comment: uses vapor   . Alcohol use Yes     Comment: 3-4 beers per day  . Drug use: No  . Sexual activity: Not on file   Other Topics Concern  . Not on file   Social History Narrative  . No narrative on file      Family History:   The patient's family history includes Depression in his mother; Diabetes Mellitus II in his father.  ROS:  Please see the history of present illness.  ROS  No syncope, no melena, no rashes All other ROS reviewed and negative.     Physical Exam/Data:   Vitals:   01/28/17 0400 01/28/17 0502 01/28/17 0600 01/28/17 0700  BP: (!) 95/56  112/77 (!) 100/59  Pulse:      Resp: 18  19 (!) 27  Temp:  98.1 F (36.7 C)    TempSrc:  Oral    SpO2: 99%  100% 95%  Weight:  155 lb 6.8 oz (70.5 kg)    Height:        Intake/Output Summary (Last 24 hours) at 01/28/17 0803 Last data filed at 01/28/17 0612  Gross per 24 hour  Intake          2636.71 ml  Output             1350 ml  Net          1286.71 ml   Filed Weights   01/27/17 1707 01/28/17 0502  Weight: 152 lb (68.9 kg) 155 lb 6.8 oz (70.5 kg)   Body mass index is 25.09 kg/m.  General:  Well nourished, well developed, in no acute distress HEENT: normal Lymph: no adenopathy Neck: no JVD Endocrine:  No thryomegaly Vascular: No carotid bruits; Cardiac:  normal S1, S2;Tachy reg; 2/6 SM  Lungs:  clear to auscultation bilaterally, no wheezing, rhonchi or rales  Abd: soft, nontender, no hepatomegaly  Ext: no edema Musculoskeletal:  Right hip deformity Skin: warm and dry  Neuro:  CNs 2-12 intact, no focal abnormalities noted Psych:  Normal affect, speaking fairly clearly, in pain  EKG:  The EKG was personally reviewed and demonstrates:  Sinus tachycardia no other abnormalities Telemetry:  Telemetry was personally reviewed and demonstrates:  Sinus tachycardia/sinus rhythm, no atrial flutter  Relevant CV Studies: Flutter ablation reviewed, successful in October 2017.  Laboratory Data:  Chemistry Recent Labs Lab 01/27/17 1437 01/28/17 0345  NA 128* 130*  K 4.9 4.1  CL 97* 102  CO2 16* 21*  GLUCOSE 99 106*  BUN 9 8  CREATININE 0.87 0.70  CALCIUM 8.3* 7.6*  GFRNONAA >60 >60  GFRAA >60 >60  ANIONGAP 15 7     No results for input(s): PROT, ALBUMIN, AST, ALT, ALKPHOS, BILITOT in the last 168 hours. Hematology Recent Labs Lab 01/27/17 1437 01/27/17 1735 01/27/17 1928 01/28/17 0345  WBC 11.6*  --   --  6.8  RBC 2.44* 2.20*  --  1.89*  HGB 9.0*  --  7.5* 6.9*  HCT 23.9*  --  20.1* 18.7*  MCV 98.0  --   --  98.9  MCH 36.9*  --   --  36.5*  MCHC 37.7*  --   --  36.9*  RDW 13.1  --   --  13.3  PLT 75*  --   --  47*   Cardiac EnzymesNo results for input(s): TROPONINI in the last 168 hours. No results for input(s): TROPIPOC in the last 168 hours.  BNPNo results for input(s): BNP, PROBNP in the last 168 hours.  DDimer No results for input(s): DDIMER in the last 168 hours.  Radiology/Studies:  Dg Pelvis 1-2 Views  Result Date: 01/27/2017 CLINICAL DATA:  Fall this morning down 15 stairs with right hip pain. EXAM: PELVIS - 1-2 VIEW COMPARISON:  None. FINDINGS: There is a displaced intertrochanteric fracture of the right hip. Mild exaggeration coxa vera deformity over the fracture site. Remainder of the exam is within normal. IMPRESSION: Displaced intertrochanteric right hip fracture. Electronically Signed   By: Marin Olp M.D.   On: 01/27/2017 15:19   Ct Head Wo Contrast  Result Date: 01/27/2017 CLINICAL DATA:  Fall earlier today downstairs, headache, on anticoagulation EXAM: CT HEAD WITHOUT CONTRAST TECHNIQUE: Contiguous axial images were obtained from the base of the skull through the vertex without intravenous contrast. COMPARISON:  None. FINDINGS: Brain: Mild brain atrophy and chronic white matter microvascular ischemic changes about the ventricles. No acute intracranial hemorrhage, mass lesion, definite infarction midline shift, herniation, hydrocephalus, or extra-axial fluid collection. Bilateral basal ganglia calcifications noted. Cisterns remain patent. Cerebellar atrophy as well. Vascular: No hyperdense vessel or unexpected calcification. Skull: Normal. Negative for fracture or focal  lesion. Sinuses/Orbits: No acute finding. Other: None. IMPRESSION: Brain atrophy and chronic white matter microvascular ischemic changes. No acute intracranial abnormality by noncontrast CT. Bilateral basal ganglia calcifications Electronically Signed   By: Jerilynn Mages.  Shick M.D.   On: 01/27/2017 15:07   Dg Chest Port 1 View  Result Date: 01/27/2017 CLINICAL DATA:  Fall this morning down 15 stairs with right hip pain. EXAM: PORTABLE CHEST 1 VIEW COMPARISON:  None. FINDINGS: Lungs are adequately inflated without consolidation or effusion. No pneumothorax. Cardiomediastinal silhouette is within normal. There is mild calcified plaque over the aortic arch. Median sternotomy wires and prosthetic heart valve are present. Old left posterolateral fifth rib fracture. IMPRESSION: No acute findings. Aortic atherosclerosis. Electronically Signed   By: Marin Olp M.D.   On: 01/27/2017 15:21   Dg Femur, Min 2 Views Right  Result Date: 01/27/2017 CLINICAL DATA:  Fall this morning down 15 stairs with right hip pain. EXAM: RIGHT FEMUR 2 VIEWS COMPARISON:  None. FINDINGS: Examination demonstrates a displaced intertrochanteric fracture of the right hip. Remainder of the exam is unremarkable. IMPRESSION: Displaced intertrochanteric right hip fracture. Electronically Signed   By: Marin Olp M.D.   On: 01/27/2017 15:18    Assessment and Plan:   Atrial flutter post ablation  - Post second ablation in October 2017 by Dr. Curt Bears. Successful.  - Eliquis discontinue because of his decreasing platelet count, severe anemia, heavy alcohol use. He is at risk for stroke if atrial flutter were to return. Last dose Friday  - Now Sinus TACH - pain response, anemia...   - With hypotension will cut back metoprolol to 50 BID. Advance when able.   - Stopped amiodarone IV.   Bioprosthetic aortic valve  - [place] (Dr. Jerelene Redden). Replaced because of severe aortic stenosis. EF was normal at 65-70%. Reviewed care everywhere records.  10/29/15.  - Restart aspirin when able. (Low PLTS)  - Dental prophylaxis in future.  Right hip fracture  - Decreasing hemoglobin, decreasing platelets  - Question in part dilutional versus blood loss  - At increased risk for bleeding during surgical repair.  - Alcohol use playing a role, question cirrhotic liver.  - From a cardiac perspective, he is of low to moderate risk for surgery. Of course from a medical perspective given his decreased hemoglobin, decreased platelets he is at increased risk for bleeding.  Alcohol use  - Encourage cessation.  - does not seem to be in DT's this AM.     Signed, Candee Furbish, MD  01/28/2017 8:03 AM

## 2017-01-29 ENCOUNTER — Inpatient Hospital Stay (HOSPITAL_COMMUNITY): Payer: BLUE CROSS/BLUE SHIELD

## 2017-01-29 ENCOUNTER — Encounter (HOSPITAL_COMMUNITY)
Admission: EM | Disposition: A | Discharge status: Home Health | Payer: Self-pay | Source: Home / Self Care | Attending: Family Medicine

## 2017-01-29 ENCOUNTER — Inpatient Hospital Stay (HOSPITAL_COMMUNITY): Payer: BLUE CROSS/BLUE SHIELD | Admitting: Anesthesiology

## 2017-01-29 ENCOUNTER — Encounter (HOSPITAL_COMMUNITY): Payer: Self-pay | Admitting: Certified Registered Nurse Anesthetist

## 2017-01-29 DIAGNOSIS — Z952 Presence of prosthetic heart valve: Secondary | ICD-10-CM

## 2017-01-29 DIAGNOSIS — I350 Nonrheumatic aortic (valve) stenosis: Secondary | ICD-10-CM

## 2017-01-29 HISTORY — PX: INTRAMEDULLARY (IM) NAIL INTERTROCHANTERIC: SHX5875

## 2017-01-29 LAB — COMPREHENSIVE METABOLIC PANEL
ALBUMIN: 2.8 g/dL — AB (ref 3.5–5.0)
ALT: 25 U/L (ref 17–63)
ANION GAP: 6 (ref 5–15)
AST: 43 U/L — ABNORMAL HIGH (ref 15–41)
Alkaline Phosphatase: 39 U/L (ref 38–126)
BILIRUBIN TOTAL: 1.1 mg/dL (ref 0.3–1.2)
CALCIUM: 8 mg/dL — AB (ref 8.9–10.3)
CO2: 24 mmol/L (ref 22–32)
Chloride: 102 mmol/L (ref 101–111)
Creatinine, Ser: 0.49 mg/dL — ABNORMAL LOW (ref 0.61–1.24)
GFR calc Af Amer: >60 mL/min (ref >60)
GFR calc non Af Amer: >60 mL/min (ref >60)
GLUCOSE: 102 mg/dL — AB (ref 65–99)
Potassium: 3.7 mmol/L (ref 3.5–5.1)
Sodium: 132 mmol/L — ABNORMAL LOW (ref 135–145)
TOTAL PROTEIN: 5 g/dL — AB (ref 6.5–8.1)

## 2017-01-29 LAB — PREPARE PLATELET PHERESIS: Unit division: 0

## 2017-01-29 LAB — CBC
HCT: 23.3 % — ABNORMAL LOW (ref 39.0–52.0)
HEMOGLOBIN: 8.6 g/dL — AB (ref 13.0–17.0)
MCH: 34.7 pg — ABNORMAL HIGH (ref 26.0–34.0)
MCHC: 36.9 g/dL — AB (ref 30.0–36.0)
MCV: 94 fL (ref 78.0–100.0)
PLATELETS: 70 10*3/uL — AB (ref 150–400)
RBC: 2.48 MIL/uL — ABNORMAL LOW (ref 4.22–5.81)
RDW: 15.7 % — ABNORMAL HIGH (ref 11.5–15.5)
WBC: 6.1 10*3/uL (ref 4.0–10.5)

## 2017-01-29 LAB — BPAM PLATELET PHERESIS
Blood Product Expiration Date: 201806252359
ISSUE DATE / TIME: 201806241439
Unit Type and Rh: 6200

## 2017-01-29 SURGERY — FIXATION, FRACTURE, INTERTROCHANTERIC, WITH INTRAMEDULLARY ROD
Anesthesia: General | Site: Hip | Laterality: Right

## 2017-01-29 SURGERY — FIXATION, FRACTURE, INTERTROCHANTERIC, WITH INTRAMEDULLARY ROD
Anesthesia: General | Laterality: Right

## 2017-01-29 MED ORDER — CEFAZOLIN SODIUM-DEXTROSE 2-4 GM/100ML-% IV SOLN
INTRAVENOUS | Status: AC
  Filled 2017-01-29: qty 100

## 2017-01-29 MED ORDER — ACETAMINOPHEN 650 MG RE SUPP: 650.0000 mg | Freq: Four times a day (QID) | RECTAL | Status: DC | PRN

## 2017-01-29 MED ORDER — METOCLOPRAMIDE HCL 5 MG/ML IJ SOLN: 5.0000 mg | Freq: Three times a day (TID) | INTRAMUSCULAR | Status: DC | PRN

## 2017-01-29 MED ORDER — ONDANSETRON HCL 4 MG PO TABS: 4.0000 mg | ORAL_TABLET | Freq: Four times a day (QID) | ORAL | Status: DC | PRN

## 2017-01-29 MED ORDER — OXYCODONE HCL 5 MG PO TABS: 5.0000 mg | ORAL_TABLET | Freq: Once | ORAL | Status: DC | PRN

## 2017-01-29 MED ORDER — FENTANYL CITRATE (PF) 250 MCG/5ML IJ SOLN
INTRAMUSCULAR | Status: AC
  Filled 2017-01-29: qty 5

## 2017-01-29 MED ORDER — HYDROMORPHONE HCL 1 MG/ML IJ SOLN
INTRAMUSCULAR | Status: AC
  Administered 2017-01-29: 0.5 mg via INTRAVENOUS
  Filled 2017-01-29: qty 0.5

## 2017-01-29 MED ORDER — SUGAMMADEX SODIUM 200 MG/2ML IV SOLN
INTRAVENOUS | Status: AC
  Filled 2017-01-29: qty 2

## 2017-01-29 MED ORDER — ROCURONIUM BROMIDE 10 MG/ML (PF) SYRINGE
PREFILLED_SYRINGE | INTRAVENOUS | Status: DC | PRN
  Administered 2017-01-29: 50 mg via INTRAVENOUS

## 2017-01-29 MED ORDER — ROCURONIUM BROMIDE 50 MG/5ML IV SOSY
PREFILLED_SYRINGE | INTRAVENOUS | Status: AC
  Filled 2017-01-29: qty 5

## 2017-01-29 MED ORDER — FENTANYL CITRATE (PF) 100 MCG/2ML IJ SOLN
INTRAMUSCULAR | Status: DC | PRN
  Administered 2017-01-29 (×2): 100 ug via INTRAVENOUS

## 2017-01-29 MED ORDER — HYDROMORPHONE HCL 1 MG/ML IJ SOLN
INTRAMUSCULAR | Status: AC
  Filled 2017-01-29: qty 0.5

## 2017-01-29 MED ORDER — ACETAMINOPHEN 325 MG PO TABS: 650.0000 mg | ORAL_TABLET | Freq: Four times a day (QID) | ORAL | Status: DC | PRN

## 2017-01-29 MED ORDER — SUGAMMADEX SODIUM 200 MG/2ML IV SOLN
INTRAVENOUS | Status: DC | PRN
  Administered 2017-01-29: 200 mg via INTRAVENOUS

## 2017-01-29 MED ORDER — SUCCINYLCHOLINE CHLORIDE 200 MG/10ML IV SOSY
PREFILLED_SYRINGE | INTRAVENOUS | Status: AC
  Filled 2017-01-29: qty 10

## 2017-01-29 MED ORDER — PHENYLEPHRINE 40 MCG/ML (10ML) SYRINGE FOR IV PUSH (FOR BLOOD PRESSURE SUPPORT)
PREFILLED_SYRINGE | INTRAVENOUS | Status: AC
  Filled 2017-01-29: qty 20

## 2017-01-29 MED ORDER — MIDAZOLAM HCL 5 MG/5ML IJ SOLN
INTRAMUSCULAR | Status: DC | PRN
  Administered 2017-01-29: 2 mg via INTRAVENOUS

## 2017-01-29 MED ORDER — MIDAZOLAM HCL 2 MG/2ML IJ SOLN
INTRAMUSCULAR | Status: AC
  Filled 2017-01-29: qty 2

## 2017-01-29 MED ORDER — OXYCODONE HCL 5 MG/5ML PO SOLN
5.0000 mg | Freq: Once | ORAL | Status: DC | PRN
  Filled 2017-01-29: qty 5

## 2017-01-29 MED ORDER — PROMETHAZINE HCL 25 MG/ML IJ SOLN: 6.2500 mg | INTRAMUSCULAR | Status: DC | PRN

## 2017-01-29 MED ORDER — ESMOLOL HCL 100 MG/10ML IV SOLN
INTRAVENOUS | Status: DC | PRN
  Administered 2017-01-29: 30 mg via INTRAVENOUS

## 2017-01-29 MED ORDER — LACTATED RINGERS IV SOLN
INTRAVENOUS | Status: DC
  Administered 2017-01-29 (×2): via INTRAVENOUS

## 2017-01-29 MED ORDER — METHOCARBAMOL 500 MG PO TABS
500.0000 mg | ORAL_TABLET | Freq: Four times a day (QID) | ORAL | Status: DC | PRN
  Administered 2017-01-29 – 2017-02-02 (×6): 500 mg via ORAL
  Filled 2017-01-29 (×6): qty 1

## 2017-01-29 MED ORDER — DEXAMETHASONE SODIUM PHOSPHATE 10 MG/ML IJ SOLN
INTRAMUSCULAR | Status: AC
  Filled 2017-01-29: qty 1

## 2017-01-29 MED ORDER — METOPROLOL TARTRATE 5 MG/5ML IV SOLN
INTRAVENOUS | Status: DC | PRN
  Administered 2017-01-29: 1 mg via INTRAVENOUS

## 2017-01-29 MED ORDER — METHOCARBAMOL 1000 MG/10ML IJ SOLN
500.0000 mg | Freq: Four times a day (QID) | INTRAVENOUS | Status: DC | PRN
  Administered 2017-01-29: 500 mg via INTRAVENOUS
  Filled 2017-01-29: qty 550

## 2017-01-29 MED ORDER — 0.9 % SODIUM CHLORIDE (POUR BTL) OPTIME
TOPICAL | Status: DC | PRN
  Administered 2017-01-29: 1000 mL

## 2017-01-29 MED ORDER — ROCURONIUM BROMIDE 50 MG/5ML IV SOSY
PREFILLED_SYRINGE | INTRAVENOUS | Status: AC
  Filled 2017-01-29: qty 10

## 2017-01-29 MED ORDER — METOPROLOL TARTRATE 5 MG/5ML IV SOLN
INTRAVENOUS | Status: AC
  Filled 2017-01-29: qty 5

## 2017-01-29 MED ORDER — CEFAZOLIN SODIUM-DEXTROSE 2-4 GM/100ML-% IV SOLN
2.0000 g | Freq: Once | INTRAVENOUS | Status: AC
  Administered 2017-01-29: 2 g via INTRAVENOUS

## 2017-01-29 MED ORDER — PROPOFOL 10 MG/ML IV BOLUS
INTRAVENOUS | Status: DC | PRN
Start: 2017-01-29 — End: 2017-01-29
  Administered 2017-01-29: 140 mg via INTRAVENOUS

## 2017-01-29 MED ORDER — CEFAZOLIN SODIUM-DEXTROSE 1-4 GM/50ML-% IV SOLN
1.0000 g | Freq: Four times a day (QID) | INTRAVENOUS | Status: AC
  Administered 2017-01-29 – 2017-01-30 (×3): 1 g via INTRAVENOUS
  Filled 2017-01-29 (×3): qty 50

## 2017-01-29 MED ORDER — HYDROMORPHONE HCL 1 MG/ML IJ SOLN
0.2500 mg | INTRAMUSCULAR | Status: DC | PRN
  Administered 2017-01-29 (×3): 0.5 mg via INTRAVENOUS

## 2017-01-29 MED ORDER — PHENYLEPHRINE 40 MCG/ML (10ML) SYRINGE FOR IV PUSH (FOR BLOOD PRESSURE SUPPORT)
PREFILLED_SYRINGE | INTRAVENOUS | Status: DC | PRN
  Administered 2017-01-29 (×4): 80 ug via INTRAVENOUS
  Administered 2017-01-29: 120 ug via INTRAVENOUS
  Administered 2017-01-29 (×3): 80 ug via INTRAVENOUS
  Administered 2017-01-29: 120 ug via INTRAVENOUS

## 2017-01-29 MED ORDER — METOCLOPRAMIDE HCL 5 MG PO TABS: 5.0000 mg | ORAL_TABLET | Freq: Three times a day (TID) | ORAL | Status: DC | PRN

## 2017-01-29 MED ORDER — ESMOLOL HCL 100 MG/10ML IV SOLN
INTRAVENOUS | Status: AC
  Filled 2017-01-29: qty 10

## 2017-01-29 MED ORDER — LIDOCAINE 2% (20 MG/ML) 5 ML SYRINGE
INTRAMUSCULAR | Status: AC
  Filled 2017-01-29: qty 5

## 2017-01-29 MED ORDER — ONDANSETRON HCL 4 MG/2ML IJ SOLN
INTRAMUSCULAR | Status: AC
  Filled 2017-01-29: qty 2

## 2017-01-29 MED ORDER — ONDANSETRON HCL 4 MG/2ML IJ SOLN: 4.0000 mg | Freq: Four times a day (QID) | INTRAMUSCULAR | Status: DC | PRN

## 2017-01-29 SURGICAL SUPPLY — 33 items
BAG ZIPLOCK 12X15 (MISCELLANEOUS) IMPLANT
BIT DRILL FLUTED FEMUR 4.2/3 (BIT) ×3 IMPLANT
BNDG GAUZE ELAST 4 BULKY (GAUZE/BANDAGES/DRESSINGS) ×3 IMPLANT
COVER PERINEAL POST (MISCELLANEOUS) ×3 IMPLANT
COVER SURGICAL LIGHT HANDLE (MISCELLANEOUS) ×3 IMPLANT
DRAPE C-ARM 42X120 X-RAY (DRAPES) IMPLANT
DRAPE INCISE IOBAN 66X45 STRL (DRAPES) ×6 IMPLANT
DRAPE ORTHO SPLIT 77X108 STRL (DRAPES)
DRAPE STERI IOBAN 125X83 (DRAPES) ×3 IMPLANT
DRAPE SURG 17X11 SM STRL (DRAPES) IMPLANT
DRAPE SURG ORHT 6 SPLT 77X108 (DRAPES) IMPLANT
DURAPREP 26ML APPLICATOR (WOUND CARE) ×3 IMPLANT
ELECT REM PT RETURN 15FT ADLT (MISCELLANEOUS) ×3 IMPLANT
GAUZE SPONGE 4X4 12PLY STRL (GAUZE/BANDAGES/DRESSINGS) ×3 IMPLANT
GAUZE XEROFORM 1X8 LF (GAUZE/BANDAGES/DRESSINGS) ×3 IMPLANT
GLOVE BIO SURGEON STRL SZ7.5 (GLOVE) ×3 IMPLANT
GLOVE INDICATOR 8.0 STRL GRN (GLOVE) ×3 IMPLANT
GOWN STRL REUS W/TWL LRG LVL3 (GOWN DISPOSABLE) ×3 IMPLANT
GUIDEWIRE 3.2X400 (WIRE) ×3 IMPLANT
KIT BASIN OR (CUSTOM PROCEDURE TRAY) ×3 IMPLANT
MANIFOLD NEPTUNE II (INSTRUMENTS) ×3 IMPLANT
NAIL TROCH FIX 11X235 RT 130 (Nail) ×3 IMPLANT
NDL SAFETY ECLIPSE 18X1.5 (NEEDLE) ×1 IMPLANT
NEEDLE HYPO 18GX1.5 SHARP (NEEDLE) ×2
PACK GENERAL/GYN (CUSTOM PROCEDURE TRAY) ×3 IMPLANT
POSITIONER SURGICAL ARM (MISCELLANEOUS) ×3 IMPLANT
SCREW LOCKING 5.0X32MM (Screw) ×3 IMPLANT
SCREW TFNA P5MM STERILE (Screw) ×3 IMPLANT
STAPLER VISISTAT 35W (STAPLE) ×6 IMPLANT
SUT VIC AB 2-0 CT1 27 (SUTURE) ×2
SUT VIC AB 2-0 CT1 TAPERPNT 27 (SUTURE) ×1 IMPLANT
SYRINGE 60CC LL (MISCELLANEOUS) ×3 IMPLANT
TOWEL OR 17X26 10 PK STRL BLUE (TOWEL DISPOSABLE) ×3 IMPLANT

## 2017-01-29 NOTE — Progress Notes (Signed)
Orthopedic Tech Progress Note Patient Details:  Naziah Weckerly Mar 18, 1966 [id number]   Ortho Tech is unable to provide OHF w/ Trapeze as Pt is not on an Ortho Bed.  Patient ID: Etan Vasudevan, male   DOB: 1966/01/02, 51 y.o.   MRN: [id number]   Charlott Rakes 01/29/2017, 1:44 PM

## 2017-01-29 NOTE — Brief Op Note (Signed)
01/27/2017 - 01/29/2017  6:48 PM  PATIENT:  Malik Lambert  51 y.o. male  PRE-OPERATIVE DIAGNOSIS:  right hip fracture  POST-OPERATIVE DIAGNOSIS:  right hip fracture  PROCEDURE:  Procedure(s): INTRAMEDULLARY (IM) NAIL INTERTROCHANTRIC,ARTHROCENITIS RIGHT KNEE (Right)  SURGEON:  Surgeon(s) and Role:    * Nicholes Stairs, MD - Primary  PHYSICIAN ASSISTANT:   ASSISTANTS: none   ANESTHESIA:   general  EBL:  Total I/O In: 1000 [I.V.:1000] Out: 1325 [Urine:1250; Blood:75]  BLOOD ADMINISTERED:none  DRAINS: none   LOCAL MEDICATIONS USED:  NONE  SPECIMEN:  No Specimen  DISPOSITION OF SPECIMEN:  N/A  COUNTS:  YES  TOURNIQUET:  * No tourniquets in log *  DICTATION: .Note written in EPIC  PLAN OF CARE: Admit to inpatient   PATIENT DISPOSITION:  PACU - hemodynamically stable.   Delay start of Pharmacological VTE agent (>24hrs) due to surgical blood loss or risk of bleeding: yes

## 2017-01-29 NOTE — Anesthesia Postprocedure Evaluation (Signed)
Anesthesia Post Note  Patient: Malik Lambert  Procedure(s) Performed: Procedure(s) (LRB): INTRAMEDULLARY (IM) NAIL INTERTROCHANTRIC,ARTHROCENITIS RIGHT KNEE (Right)     Patient location during evaluation: PACU Anesthesia Type: General Level of consciousness: awake and alert Pain management: pain level controlled Vital Signs Assessment: post-procedure vital signs reviewed and stable Respiratory status: spontaneous breathing, nonlabored ventilation, respiratory function stable and patient connected to nasal cannula oxygen Cardiovascular status: blood pressure returned to baseline and stable Postop Assessment: no signs of nausea or vomiting Anesthetic complications: no    Last Vitals:  Vitals:   01/29/17 1945 01/29/17 1955  BP: (!) 135/91   Pulse: (!) 114 (!) 122  Resp: 16 17  Temp: 36.7 C 36.9 C    Last Pain:  Vitals:   01/29/17 1955  TempSrc:   PainSc: [place]

## 2017-01-29 NOTE — Transfer of Care (Signed)
Immediate Anesthesia Transfer of Care Note  Patient: Malik Lambert  Procedure(s) Performed: Procedure(s): INTRAMEDULLARY (IM) NAIL INTERTROCHANTRIC,ARTHROCENITIS RIGHT KNEE (Right)  Patient Location: PACU  Anesthesia Type:General  Level of Consciousness: awake, alert  and oriented  Airway & Oxygen Therapy: Patient Spontanous Breathing and Patient connected to face mask oxygen  Post-op Assessment: Report given to RN and Post -op Vital signs reviewed and stable  Post vital signs: Reviewed and stable  Last Vitals:  Vitals:   01/29/17 1626 01/29/17 1648  BP: (!) 138/99   Pulse:    Resp:  17  Temp:      Last Pain:  Vitals:   01/29/17 1631  TempSrc:   PainSc: 8       Patients Stated Pain Goal: 2 (41/28/20 8138)  Complications: No apparent anesthesia complications

## 2017-01-29 NOTE — Plan of Care (Signed)
Problem: Activity: Goal: Ability to ambulate and perform ADLs will improve Outcome: Progressing Awaiting surgery for IM nailing.

## 2017-01-29 NOTE — Progress Notes (Signed)
Progress Note  Patient Name: Malik Lambert Date of Encounter: 01/29/2017  Primary Cardiologist: Dr. Curt Bears  Subjective   Resting comfortable, no chest pain or SOB.  He is hungry for surgery today at 2:30PM  Inpatient Medications    Scheduled Meds: . chlordiazePOXIDE  15 mg Oral TID   Followed by  . chlordiazePOXIDE  15 mg Oral BID  . fluticasone  2 spray Each Nare Daily  . folic acid  1 mg Oral Daily  . LORazepam  0-4 mg Intravenous Q6H   Followed by  . LORazepam  0-4 mg Intravenous Q12H  . metoprolol tartrate  50 mg Oral BID  . multivitamin with minerals  1 tablet Oral Daily  . pravastatin  20 mg Oral QHS  . thiamine  100 mg Oral Daily   Or  . thiamine  100 mg Intravenous Daily   Continuous Infusions:  PRN Meds: albuterol, fentaNYL (SUBLIMAZE) injection, HYDROcodone-acetaminophen, LORazepam **OR** LORazepam, metoprolol tartrate, ondansetron (ZOFRAN) IV   Vital Signs    Vitals:   01/29/17 0300 01/29/17 0309 01/29/17 0400 01/29/17 0700  BP: 120/72  110/74 (!) 113/92  Pulse:      Resp: 11  (!) 21 14  Temp:  98.7 F (37.1 C)    TempSrc:  Axillary    SpO2: 100%  99% 98%  Weight:      Height:        Intake/Output Summary (Last 24 hours) at 01/29/17 0745 Last data filed at 01/29/17 0400  Gross per 24 hour  Intake          2281.06 ml  Output             3750 ml  Net         -1468.94 ml   Filed Weights   01/27/17 1707 01/28/17 0502  Weight: 152 lb (68.9 kg) 155 lb 6.8 oz (70.5 kg)    Telemetry    SR to ST with PVCs currently HR 93 occ PVCs. - Personally Reviewed  ECG    Last one 01/27/17. No new - Personally Reviewed  Physical Exam   GEN: No acute distress.   Neck: No JVD Cardiac: RRR, soft 9-4/4 systolic murmurs, no rubs, or gallops.  Respiratory: Clear to auscultation bilaterally. GI: Soft, nontender, non-distended  MS: No edema; No deformity. Neuro:  Nonfocal  Psych: Normal affect   Labs    Chemistry Recent Labs Lab 01/27/17 1437  01/28/17 0345 01/28/17 0853 01/29/17 0407  NA 128* 130*  --  132*  K 4.9 4.1  --  3.7  CL 97* 102  --  102  CO2 16* 21*  --  24  GLUCOSE 99 106*  --  102*  BUN 9 8  --  <5*  CREATININE 0.87 0.70  --  0.49*  CALCIUM 8.3* 7.6*  --  8.0*  PROT  --   --  5.3* 5.0*  ALBUMIN  --   --  3.0* 2.8*  AST  --   --  52* 43*  ALT  --   --  26 25  ALKPHOS  --   --  37* 39  BILITOT  --   --  0.6 1.1  GFRNONAA >60 >60  --  >60  GFRAA >60 >60  --  >60  ANIONGAP 15 7  --  6     Hematology Recent Labs Lab 01/28/17 0345 01/28/17 1904 01/29/17 0407  WBC 6.8 8.5 6.1  RBC 1.89* 2.80* 2.48*  HGB 6.9* 9.4* 8.6*  HCT 18.7* 26.1* 23.3*  MCV 98.9 93.2 94.0  MCH 36.5* 33.6 34.7*  MCHC 36.9* 36.0 36.9*  RDW 13.3 15.6* 15.7*  PLT 47* 74* 70*    Cardiac EnzymesNo results for input(s): TROPONINI in the last 168 hours. No results for input(s): TROPIPOC in the last 168 hours.   BNPNo results for input(s): BNP, PROBNP in the last 168 hours.   DDimer No results for input(s): DDIMER in the last 168 hours.   Radiology    Dg Pelvis 1-2 Views  Result Date: 01/27/2017 CLINICAL DATA:  Fall this morning down 15 stairs with right hip pain. EXAM: PELVIS - 1-2 VIEW COMPARISON:  None. FINDINGS: There is a displaced intertrochanteric fracture of the right hip. Mild exaggeration coxa vera deformity over the fracture site. Remainder of the exam is within normal. IMPRESSION: Displaced intertrochanteric right hip fracture. Electronically Signed   By: Marin Olp M.D.   On: 01/27/2017 15:19   Ct Head Wo Contrast  Result Date: 01/27/2017 CLINICAL DATA:  Fall earlier today downstairs, headache, on anticoagulation EXAM: CT HEAD WITHOUT CONTRAST TECHNIQUE: Contiguous axial images were obtained from the base of the skull through the vertex without intravenous contrast. COMPARISON:  None. FINDINGS: Brain: Mild brain atrophy and chronic white matter microvascular ischemic changes about the ventricles. No acute  intracranial hemorrhage, mass lesion, definite infarction midline shift, herniation, hydrocephalus, or extra-axial fluid collection. Bilateral basal ganglia calcifications noted. Cisterns remain patent. Cerebellar atrophy as well. Vascular: No hyperdense vessel or unexpected calcification. Skull: Normal. Negative for fracture or focal lesion. Sinuses/Orbits: No acute finding. Other: None. IMPRESSION: Brain atrophy and chronic white matter microvascular ischemic changes. No acute intracranial abnormality by noncontrast CT. Bilateral basal ganglia calcifications Electronically Signed   By: Jerilynn Mages.  Shick M.D.   On: 01/27/2017 15:07   Dg Chest Port 1 View  Result Date: 01/27/2017 CLINICAL DATA:  Fall this morning down 15 stairs with right hip pain. EXAM: PORTABLE CHEST 1 VIEW COMPARISON:  None. FINDINGS: Lungs are adequately inflated without consolidation or effusion. No pneumothorax. Cardiomediastinal silhouette is within normal. There is mild calcified plaque over the aortic arch. Median sternotomy wires and prosthetic heart valve are present. Old left posterolateral fifth rib fracture. IMPRESSION: No acute findings. Aortic atherosclerosis. Electronically Signed   By: Marin Olp M.D.   On: 01/27/2017 15:21   Dg Femur, Min 2 Views Right  Result Date: 01/27/2017 CLINICAL DATA:  Fall this morning down 15 stairs with right hip pain. EXAM: RIGHT FEMUR 2 VIEWS COMPARISON:  None. FINDINGS: Examination demonstrates a displaced intertrochanteric fracture of the right hip. Remainder of the exam is unremarkable. IMPRESSION: Displaced intertrochanteric right hip fracture. Electronically Signed   By: Marin Olp M.D.   On: 01/27/2017 15:18   US Abdomen Limited Ruq  Result Date: 01/28/2017 CLINICAL DATA:  All volvulus EXAM: ULTRASOUND ABDOMEN LIMITED RIGHT UPPER QUADRANT COMPARISON:  None. FINDINGS: Gallbladder: No gallstones or wall thickening visualized. No sonographic Murphy sign noted by sonographer. Common bile  duct: Diameter: Normal at 4 mm Liver: Increased liver echogenicity compared to the kidneys. No duct dilatation. No focal lesion. IMPRESSION: 1. Increased liver echogenicity commonly represents hepatic steatosis. Finding could indicate hepatocellular disease (cirrhosis). 2. Normal gallbladder.  No biliary duct dilatation. Electronically Signed   By: Suzy Bouchard M.D.   On: 01/28/2017 11:48    Cardiac Studies   Echo 04/14/16: 2017 with EF 55%, Lt atrium markedly dilated, mild AR and Mod. MR.   TEE echo 10/29/15 EF 65-70% mod. LA  enlargement. Mild LVH.  Patient Profile     51 y.o. male with a hx of Heavy alcohol use, atrial flutter ablation 2, bioprosthetic aortic valve replacement who is being followed by Cardiology for the evaluation of atrial flutter at the request of Dr Candiss Norse. Also for risk eval for surgery intramedullary nail intertrochantric placed on Rt for today.    Assessment & Plan    Hx a flutter with ablation.  Second ablation 05/2016 successful  --Eliqus discontinued due to decreasing platelet count, severe anemia and heavy ETOH use. At risk for CVA if a flutter were to return.  --maintaining ST to SR at 93 with occ PVC --hypotension with metoprolol decreased to 50 mg BID.  --IV amiodarone stopped  Bioprosthetic aortic valve --re[place] in [place] Dr. Jerelene Redden, replaced due to severe aortic stenosis.  --recent echo 04/2016 with EF 55%, markedly dilated LA mod MR.  --dental prophylaxis  Rt hip fracture --for surgery today from cardiology perspective he is low to mod. Risk per Dr. Marlou Porch.  But monitor with anemia and thrombocytopeniz.    ETOH use.  --encourage cessation  Anemia Hgb today 8.6 and plts 70     Signed, Cecilie Kicks, NP  01/29/2017, 7:45 AM    Attending Note:   The patient was seen and examined.  Agree with assessment and plan as noted above.  Changes made to the above note as needed.  Patient seen and independently examined with Cecilie Kicks, NP.   We  discussed all aspects of the encounter. I agree with the assessment and plan as stated above.  1.   Pre -op eval : He is at low - moderate risk for his hip surgery . Does not have severe AS or AI by exam.  For hip repair today .    I have spent a total of 30 minutes with patient reviewing hospital  notes , telemetry, EKGs, labs and examining patient as well as establishing an assessment and plan that was discussed with the patient. > 50% of time was spent in direct patient care.    Thayer Headings, Brooke Bonito., MD, [facility] 01/29/2017, 10:56 AM [address]  [place] Pager [phone]

## 2017-01-29 NOTE — Anesthesia Preprocedure Evaluation (Addendum)
Anesthesia Evaluation  Patient identified by MRN, date of birth, ID band Patient awake    Reviewed: Allergy & Precautions, H&P , Patient's Chart, lab work & pertinent test results, reviewed documented beta blocker date and time   Airway Mallampati: II  TM Distance: >3 FB Neck ROM: full    Dental no notable dental hx.    Pulmonary Current Smoker,    Pulmonary exam normal breath sounds clear to auscultation       Cardiovascular hypertension,  Rhythm:regular Rate:Normal     Neuro/Psych    GI/Hepatic   Endo/Other    Renal/GU      Musculoskeletal   Abdominal   Peds  Hematology   Anesthesia Other Findings Low plts at 70K EF 55% Anemia.... 8.6  Reproductive/Obstetrics                                                             Anesthesia Evaluation  Patient identified by MRN, date of birth, ID band Patient awake    Reviewed: Allergy & Precautions, NPO status , Patient's Chart, lab work & pertinent test results, reviewed documented beta blocker date and time   Airway Mallampati: II  TM Distance: >3 FB Neck ROM: Full    Dental no notable dental hx.    Pulmonary neg pulmonary ROS, Current Smoker,    Pulmonary exam normal breath sounds clear to auscultation       Cardiovascular hypertension, + Peripheral Vascular Disease  negative cardio ROS Normal cardiovascular exam Rhythm:Regular Rate:Normal     Neuro/Psych Alcohol abusenegative neurological ROS     GI/Hepatic negative GI ROS, Neg liver ROS, GERD  ,  Endo/Other  negative endocrine ROS  Renal/GU negative Renal ROS  negative genitourinary   Musculoskeletal negative musculoskeletal ROS (+)   Abdominal   Peds negative pediatric ROS (+)  Hematology negative hematology ROS (+) anemia ,   Anesthesia Other Findings   Reproductive/Obstetrics negative OB ROS                              Anesthesia Physical Anesthesia Plan  ASA: III and emergent  Anesthesia Plan: General   Post-op Pain Management:    Induction: Intravenous  PONV Risk Score and Plan: 2 and Ondansetron, Dexamethasone and Treatment may vary due to age or medical condition  Airway Management Planned: Oral ETT  Additional Equipment:   Intra-op Plan:   Post-operative Plan: Extubation in OR  Informed Consent: I have reviewed the patients History and Physical, chart, labs and discussed the procedure including the risks, benefits and alternatives for the proposed anesthesia with the patient or authorized representative who has indicated his/her understanding and acceptance.   Dental advisory given  Plan Discussed with: CRNA  Anesthesia Plan Comments:         Anesthesia Quick Evaluation  Anesthesia Physical Anesthesia Plan  ASA: III  Anesthesia Plan: General   Post-op Pain Management:    Induction: Intravenous  PONV Risk Score and Plan:   Airway Management Planned: Oral ETT  Additional Equipment:   Intra-op Plan:   Post-operative Plan: Extubation in OR  Informed Consent: I have reviewed the patients History and Physical, chart, labs and discussed the procedure including the risks, benefits and alternatives for the proposed anesthesia with  the patient or authorized representative who has indicated his/her understanding and acceptance.   Dental Advisory Given  Plan Discussed with: CRNA and Surgeon  Anesthesia Plan Comments: (  )        Anesthesia Quick Evaluation

## 2017-01-29 NOTE — Consult Note (Addendum)
ORTHOPAEDIC CONSULTATION  REQUESTING PHYSICIAN: Patrecia Pour, MD  PCP:  Secundino Ginger, PA-C  Chief Complaint: Right intertrochanteric hip fracture  HPI: Malik Lambert is a 51 y.o. male with multiple medical comorbidities including atrial fibrillation, rheumatic heart disease, status post aortic valve replacement in 2017, on chronic Eliquis and aspirin for antiplatelet as well as dyslipidemia severe alcohol abuse and fatty liver disease first possible cirrhosis. He presented to the emergency department after a fall down 15 stairs and right-sided hip pain. He was evaluated by my partner Dr. Rolena Infante on June 23 found to have a intertrochanteric right hip fracture. At that time he was fully anticoagulated and also found to be thrombocytopenic. Due to those issues he has been in the medical ICU has received 2 units of packed red blood cells as well as 1 unit of platelets. At this time I have been asked to take over management for my partner Dr. Tonita Cong now that he is medically cleared.  His only real complaint at this time is of persistent right hip pain.  He's been getting benzodiazepines in the medical stepdown unit for his alcohol withdrawal. Currently he is only complaining of right hip pain denies any abdominal pain shortness of breath or numbness in the right lower extremity.  Past Medical History:  Diagnosis Date  . Acid reflux   . Alcohol abuse   . Anxiety disorder   . Aortic stenosis   . Aortic valve defect   . Ascending aortic aneurysm ([place])   . Atrial flutter ([place])   . Chronic constipation   . Dyspnea   . GERD (gastroesophageal reflux disease)   . Heart murmur   . Heart murmur   . High risk medication use   . Hypertension, essential   . LVH (left ventricular hypertrophy)   . Mixed hyperlipidemia   . PAD (peripheral artery disease) ([place])   . Rheumatic fever/heart disease   . Vitamin D deficiency    Past Surgical History:  Procedure Laterality Date  . ABLATION OF  DYSRHYTHMIC FOCUS  05/17/2016   atrial flutter  . APPENDECTOMY  1988  . arotic valve replacement  12/2015  . ELECTROPHYSIOLOGIC STUDY N/A 05/17/2016   Procedure: A-Flutter Ablation;  Surgeon: Will Meredith Leeds, MD;  Location: [facility];  Service: Cardiovascular;  Laterality: N/A;  . TENDON REPAIR  1986   Social History   Social History  . Marital status: Single    Spouse name: N/A  . Number of children: N/A  . Years of education: N/A   Social History Main Topics  . Smoking status: Current Every Day Smoker    Packs/day: 1.00    Years: 30.00    Types: Cigarettes  . Smokeless tobacco: Never Used     Comment: uses vapor   . Alcohol use Yes     Comment: 3-4 beers per day  . Drug use: No  . Sexual activity: Not Asked   Other Topics Concern  . None   Social History Narrative  . None   Family History  Problem Relation Age of Onset  . Depression Mother   . Diabetes Mellitus II Father    No Known Allergies Prior to Admission medications   Medication Sig Start Date End Date Taking? Authorizing Provider  aspirin 81 MG chewable tablet Chew 81 mg by mouth daily. 01/03/16  Yes [provider]  ELIQUIS 5 MG TABS tablet Take 5 mg by mouth 2 (two) times daily.  04/19/16  Yes [provider]  fluticasone (FLONASE) 50 MCG/ACT nasal spray Place 2 sprays into both nostrils daily.   Yes [provider]  lisinopril (PRINIVIL,ZESTRIL) 20 MG tablet Take 20 mg by mouth daily.    Yes [provider]  metoprolol (LOPRESSOR) 100 MG tablet Take 100 mg by mouth 2 (two) times daily.  01/03/16  Yes [provider]  pravastatin (PRAVACHOL) 20 MG tablet Take 20 mg by mouth daily.    Yes [provider]   US Abdomen Limited Ruq  Result Date: 01/28/2017 CLINICAL DATA:  All volvulus EXAM: ULTRASOUND ABDOMEN LIMITED RIGHT UPPER QUADRANT COMPARISON:  None. FINDINGS: Gallbladder: No gallstones or wall thickening visualized. No sonographic Murphy  sign noted by sonographer. Common bile duct: Diameter: Normal at 4 mm Liver: Increased liver echogenicity compared to the kidneys. No duct dilatation. No focal lesion. IMPRESSION: 1. Increased liver echogenicity commonly represents hepatic steatosis. Finding could indicate hepatocellular disease (cirrhosis). 2. Normal gallbladder.  No biliary duct dilatation. Electronically Signed   By: Suzy Bouchard M.D.   On: 01/28/2017 11:48    Positive ROS: All other systems have been reviewed and were otherwise negative with the exception of those mentioned in the HPI and as above.  Physical Exam: General: Alert, no acute distress Cardiovascular: No pedal edema Respiratory: No cyanosis, no use of accessory musculature GI: No organomegaly, abdomen is soft and non-tender Skin: No lesions in the area of chief complaint Neurologic: Sensation intact distally Psychiatric: Patient is competent for consent with normal mood and affect Lymphatic: No axillary or cervical lymphadenopathy  MUSCULOSKELETAL:  Physical examination of right lower shoulder demonstrates a shortened and Rotated lower extremity. He has no open wounds about the right leg. He endorses sensation intact to light touch in the deep and superficial peroneal nerves as well as the tibial nerve. Has 2+ dorsalis pedis pulse. Motor is intact with tibialis anterior/gastroc/flexor hallucis/extensor hallucis longus muscles.  Assessment: Right hip peritrochanteric fracture, closed.  Plan: -Due to the unstable nature of this injury and the necessity of early operative intervention will move forward with operative fixation today. -We'll manage this with intramedullary nail fixation of the right hip. Anticipate that he can resume his antiplatelet and anticoagulation medications on postoperative day #1. We will also allow at least 50% weightbearing depending on our reduction intraoperatively. -The risks, benefits, and alternatives were discussed with the  patient. There are risks associated with the surgery including, but not limited to, problems with anesthesia (death), infection, differences in leg length/angulation/rotation, fracture of bones, loosening or failure of implants, malunion, nonunion, hematoma (blood accumulation) which may require surgical drainage, blood clots, pulmonary embolism, nerve injury (foot drop), and blood vessel injury. The patient understands these risks and elects to proceed. -At this time he's not experiencing any symptoms of alcohol withdrawal and he is lucid and coherent as well as competent for consent. -We will plan on return to the medical service postoperatively for perioperative medical management.    Nicholes Stairs, MD Cell [phone]    01/29/2017 4:23 PM

## 2017-01-29 NOTE — Progress Notes (Signed)
PROGRESS NOTE  Malik Lambert  XHB:[id number] DOB: 1966-01-28 DOA: 01/27/2017 PCP: Secundino Ginger, PA-C   Brief Narrative: Malik Lambert is a 51 y.o. 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 He displayed evidence of alcohol withdrawal at admission, started on librium and CIWA. Flutter waves initially noted, so amiodarone was started in addition to ~3L IVF's given for hypotension. Cardiology was consulted and the patient continues to be hypotensive, so their recommendation is to discontinue amiodarone with no evidence of flutter since admission and cutting home metoprolol in half. Sinus tachycardia thought to be due to ongoing anemia and pain, so 2u PRBCs and 1u platelets have been ordered in addition to fentanyl IV. He last took eliquis 6/22 PM, so orthopedics is planning open surgery 6/25.   Assessment & Plan: Principal Problem:   Displaced intertrochanteric fracture of right femur, initial encounter for closed fracture [facility]) Active Problems:   Atrial flutter ([place])   Aortic stenosis   Hypertension, essential   Mixed hyperlipidemia   PAD (peripheral artery disease) (HCC)   GERD (gastroesophageal reflux disease)   Alcohol abuse   Closed displaced intertrochanteric fracture of right femur (HCC)  Mechanical fall with right intertrochanteric hip fracture:  - Timing of surgery per Dr. Tonita Cong. CBC improved with transfusions, holding anticoagulation (last took eliquis 6/22 PM) - Fentanyl for pain control to minimize hypotensive response. BP improved. - Bed rest, NPO today pending surgery  Alcohol abuse with evidence of withdrawal at admission: Longterm drinking 6-8 beers nightly. Last drink was 6/22 PM. Abd U/S demonstrated steatosis vs.  cirrhosis.  - Cessation counseling provided - Librium taper ordered, CIWA SDU protocol  Paroxysmal atrial flutter s/p ablation: Currently in sinus tachycardia persistently. TSH wnl.  - Appreciate cardiology assistance. BP improving, so ?restarting home dose of metoprolol. Prn beta blocker ordered.  - Holding eliquis and ASA. Will need discussion of risks/benefits of these with thrombocytopenia, alcohol use, and h/o ablation.   HTN: Initially hypotensive. AM cortisol ok at 14.  - Monitor on half dose metoprolol and holding lisinopril.   PAD: No critical ischemia noted - Continue statin, holding ASA/anticoagulation  Normocytic anemia: Iron studies indicate deficiency, and suspect significant hemodilutional component in addition to acute blood loss related to fracture. 2u PRBCs 6/24 with increase hgb 6.9 > 8.6.  - Will give iron supplement during recovery period - Monitor I/O's carefully to avoid overload  Thrombocytopenia: Plt <50k and surgery planned. No evidence of impaired hepatic synthetic function with normal INR, and no schistocytes noted on smear. Plt's improved to 70 with 1u transfusion 6/24.  - Monitor.   Hyponatremia: Mild, improving, suspect potomania, serum osm slightly low at 269. - Continue IVF's and monitor.  DVT prophylaxis: SCDs Code Status: Full Family Communication: Girlfriend and daughter at bedside. Disposition Plan: Continue SDU  Consultants:   Orthopedics, Dr. Tonita Cong  Cardiology, Dr. Marlou Porch  Procedures:   None  Antimicrobials:  None   Subjective: Pain improved with increased dose of fentanyl. Denies chest pain, palpitations, or dyspnea.   Objective: Vitals:   01/29/17 0309 01/29/17 0400 01/29/17 0700 01/29/17 0755  BP:  110/74 (!) 113/92   Pulse:      Resp:  (!) 21 14   Temp: 98.7 F (37.1 C)  98.2 F (36.8 C)  TempSrc: Axillary   Oral  SpO2:  99% 98%   Weight:      Height:        Intake/Output Summary (Last 24 hours) at 01/29/17  2952 Last data filed at 01/29/17 0400  Gross per 24 hour  Intake             2071 ml  Output             3750 ml  Net            -1679 ml   Filed Weights   01/27/17 1707 01/28/17 0502  Weight: 68.9 kg (152 lb) 70.5 kg (155 lb 6.8 oz)    Examination: General exam: 51 y.o. male in no distress  Respiratory system: Non-labored breathing room air. Clear to auscultation bilaterally.  Cardiovascular system: Regular tachycardia. II/VI early systolic blowing murmur at RUSB, rub, or gallop. No JVD, and no pedal edema. Gastrointestinal system: Abdomen soft, non-tender, non-distended, with normoactive bowel sounds. No organomegaly or masses felt. Central nervous system: Alert and oriented. No focal neurological deficits. Extremities: RLE externally rotated with tenderness to palpation at hip, ROM limited by pain. Compartments soft, no ecchymoses. SILT and motor function 5/5 in feet. Cap refill brisk throughout.  Skin: No rashes, lesions no ulcers Psychiatry: Judgement and insight appear normal. Mood & affect appropriate.   CBC:  Recent Labs Lab 01/27/17 1437 01/27/17 1928 01/28/17 0345 01/28/17 1904 01/29/17 0407  WBC 11.6*  --  6.8 8.5 6.1  NEUTROABS 8.5*  --  4.4 5.9  --   HGB 9.0* 7.5* 6.9* 9.4* 8.6*  HCT 23.9* 20.1* 18.7* 26.1* 23.3*  MCV 98.0  --  98.9 93.2 94.0  PLT 75*  --  47* 74* 70*   Basic Metabolic Panel:  Recent Labs Lab 01/27/17 1437 01/28/17 0345 01/29/17 0407  NA 128* 130* 132*  K 4.9 4.1 3.7  CL 97* 102 102  CO2 16* 21* 24  GLUCOSE 99 106* 102*  BUN 9 8 <5*  CREATININE 0.87 0.70 0.49*  CALCIUM 8.3* 7.6* 8.0*   GFR: Estimated Creatinine Clearance: 97.5 mL/min (A) (by C-G formula based on SCr of 0.49 mg/dL (L)).  Coagulation Profile:  Recent Labs Lab 01/27/17 1437  INR 1.12   Thyroid Function Tests:  Recent Labs  01/27/17 1735  TSH 3.969   Anemia Panel:  Recent Labs  01/27/17 1735  VITAMINB12 231  FOLATE 17.0  FERRITIN 1,249*  TIBC  190*  IRON 32*  RETICCTPCT 2.5   Radiology Studies: Dg Pelvis 1-2 Views  Result Date: 01/27/2017 CLINICAL DATA:  Fall this morning down 15 stairs with right hip pain. EXAM: PELVIS - 1-2 VIEW COMPARISON:  None. FINDINGS: There is a displaced intertrochanteric fracture of the right hip. Mild exaggeration coxa vera deformity over the fracture site. Remainder of the exam is within normal. IMPRESSION: Displaced intertrochanteric right hip fracture. Electronically Signed   By: Marin Olp M.D.   On: 01/27/2017 15:19   Ct Head Wo Contrast  Result Date: 01/27/2017 CLINICAL DATA:  Fall earlier today downstairs, headache, on anticoagulation EXAM: CT HEAD WITHOUT CONTRAST TECHNIQUE: Contiguous axial images were obtained from the base of the skull through the vertex without intravenous contrast. COMPARISON:  None. FINDINGS: Brain: Mild brain atrophy and chronic white matter microvascular ischemic changes about the ventricles. No acute intracranial hemorrhage, mass lesion, definite infarction midline shift, herniation, hydrocephalus, or extra-axial fluid collection. Bilateral basal ganglia calcifications noted. Cisterns remain patent. Cerebellar atrophy  as well. Vascular: No hyperdense vessel or unexpected calcification. Skull: Normal. Negative for fracture or focal lesion. Sinuses/Orbits: No acute finding. Other: None. IMPRESSION: Brain atrophy and chronic white matter microvascular ischemic changes. No acute intracranial abnormality by noncontrast CT. Bilateral basal ganglia calcifications Electronically Signed   By: Jerilynn Mages.  Shick M.D.   On: 01/27/2017 15:07   Dg Chest Port 1 View  Result Date: 01/27/2017 CLINICAL DATA:  Fall this morning down 15 stairs with right hip pain. EXAM: PORTABLE CHEST 1 VIEW COMPARISON:  None. FINDINGS: Lungs are adequately inflated without consolidation or effusion. No pneumothorax. Cardiomediastinal silhouette is within normal. There is mild calcified plaque over the aortic arch.  Median sternotomy wires and prosthetic heart valve are present. Old left posterolateral fifth rib fracture. IMPRESSION: No acute findings. Aortic atherosclerosis. Electronically Signed   By: Marin Olp M.D.   On: 01/27/2017 15:21   Dg Femur, Min 2 Views Right  Result Date: 01/27/2017 CLINICAL DATA:  Fall this morning down 15 stairs with right hip pain. EXAM: RIGHT FEMUR 2 VIEWS COMPARISON:  None. FINDINGS: Examination demonstrates a displaced intertrochanteric fracture of the right hip. Remainder of the exam is unremarkable. IMPRESSION: Displaced intertrochanteric right hip fracture. Electronically Signed   By: Marin Olp M.D.   On: 01/27/2017 15:18   US Abdomen Limited Ruq  Result Date: 01/28/2017 CLINICAL DATA:  All volvulus EXAM: ULTRASOUND ABDOMEN LIMITED RIGHT UPPER QUADRANT COMPARISON:  None. FINDINGS: Gallbladder: No gallstones or wall thickening visualized. No sonographic Murphy sign noted by sonographer. Common bile duct: Diameter: Normal at 4 mm Liver: Increased liver echogenicity compared to the kidneys. No duct dilatation. No focal lesion. IMPRESSION: 1. Increased liver echogenicity commonly represents hepatic steatosis. Finding could indicate hepatocellular disease (cirrhosis). 2. Normal gallbladder.  No biliary duct dilatation. Electronically Signed   By: Suzy Bouchard M.D.   On: 01/28/2017 11:48    LOS: 2 days   Time spent: 35 minutes.  Vance Gather, MD [facility] Pager [phone]  If 7PM-7AM, please contact night-coverage [email or web address] Password [email or web address] 01/29/2017, 8:22 AM

## 2017-01-29 NOTE — Anesthesia Procedure Notes (Signed)
Procedure Name: Intubation Performed by: Gean Maidens Pre-anesthesia Checklist: Patient identified, Emergency Drugs available, Suction available, Patient being monitored and Timeout performed Patient Re-evaluated:Patient Re-evaluated prior to inductionOxygen Delivery Method: Circle system utilized Preoxygenation: Pre-oxygenation with 100% oxygen Intubation Type: IV induction Ventilation: Mask ventilation without difficulty Laryngoscope Size: Mac and 4 Grade View: Grade I Tube type: Oral Tube size: 7.5 mm Number of attempts: 1 Airway Equipment and Method: Stylet Placement Confirmation: ETT inserted through vocal cords under direct vision,  positive ETCO2,  CO2 detector and breath sounds checked- equal and bilateral Secured at: 23 cm Tube secured with: Tape Dental Injury: Teeth and Oropharynx as per pre-operative assessment

## 2017-01-29 NOTE — Op Note (Signed)
Date of Surgery: 01/29/2017  INDICATIONS: Malik Lambert is a 51 y.o.-year-old male who sustained a right hip fracture. He has a very compensated past medical history including chronic anticoagulation, as well as alcohol abuse, history of artificial aortic valve replacement. He sustained acute blood loss anemia following the fracture and was needed to be resuscitated with 2 units of packed red blood cells as well as a unit of platelets due to his thrombocytopenia. Following his medical stabilization I was asked to assume care by my partner Dr. Tonita Cong. He was indicated for operative fixation due to the unstable nature of the fracture. We discussed the risks benefits and indications of the procedure including but not limited to bleeding, infection, limb length inequality, nonhealing fracture, malunion, need for future surgery, blood clots, and the risk of anesthesia. The risks and benefits of the procedure discussed with the patient prior to the procedure and all questions were answered; consent was obtained.  PREOPERATIVE DIAGNOSIS: right hip fracture   POSTOPERATIVE DIAGNOSIS: Same   PROCEDURE: Treatment of intertrochanteric, pertrochanteric, subtrochanteric fracture with intramedullary implant. CPT [phone]   SURGEON: Dannielle Karvonen. Stann Mainland, M.D.   ANESTHESIA: general   IV FLUIDS AND URINE: See anesthesia record   ESTIMATED BLOOD LOSS: 75 cc  IMPLANTS:  Synthes TFN,A 11 mm by intermediate length 95 mm compression screw 32 mm distal interlocking screw  DRAINS: None.   COMPLICATIONS: None.   DESCRIPTION OF PROCEDURE: The patient was brought to the operating room and placed supine on the operating table. The patient's leg had been signed prior to the procedure. The patient had the anesthesia placed by the anesthesiologist. The prep verification and incision time-outs were performed to confirm that this was the correct patient, site, side and location. The patient had an SCD on the opposite lower  extremity. The patient did receive antibiotics prior to the incision and was re-dosed during the procedure as needed at indicated intervals. The patient was positioned on the fracture table with the table in traction and internal rotation to reduce the hip. The well leg was placed in a scissor position and all bony prominences were well-padded. The patient had the lower extremity prepped and draped in the standard surgical fashion. The incision was made 4 finger breadths superior to the greater trochanter.  A guide pin was inserted into the tip of the greater trochanter under fluoroscopic guidance. An opening reamer was used to gain access to the femoral canal. The nail length was measured and inserted down the femoral canal to its proper depth. The appropriate version of insertion for the lag screw was found under fluoroscopy. The length of the lag screw was then measured. The lag screw was inserted as near to center-center in the head as possible. The leg was taken out of traction, then the interdigitating compression screw was used to compress across the fracture. Compression was visualized on serial xrays. The wound was copiously irrigated with saline and the subcutaneous layer closed with 2.0 vicryl and the skin was reapproximated with staples. The wounds were cleaned and dried a final time and a sterile dressing was placed. The hip was taken through a range of motion at the end of the case under fluoroscopic imaging to visualize the approach-withdraw phenomenon and confirm implant length in the head. The patient was then awakened from anesthesia and taken to the recovery room in stable condition. All counts were correct at the end of the case.   POSTOPERATIVE PLAN: The patient will be 50% weight bearing  as tolerated and will return in 2 weeks for staple removal and the patient will receive DVT prophylaxis based on other medications, activity level, and risk ratio of bleeding to thrombosis.  He will re start  his chronic anticoagulation meds tomorrow.  Return to the medicine floor.   Malik Lambert, [place] [phone] 7:46 PM

## 2017-01-30 ENCOUNTER — Encounter (HOSPITAL_COMMUNITY): Payer: Self-pay | Admitting: Orthopedic Surgery

## 2017-01-30 ENCOUNTER — Inpatient Hospital Stay (HOSPITAL_COMMUNITY): Payer: BLUE CROSS/BLUE SHIELD

## 2017-01-30 DIAGNOSIS — F10230 Alcohol dependence with withdrawal, uncomplicated: Secondary | ICD-10-CM

## 2017-01-30 DIAGNOSIS — W19XXXD Unspecified fall, subsequent encounter: Secondary | ICD-10-CM

## 2017-01-30 DIAGNOSIS — R000 Tachycardia, unspecified: Secondary | ICD-10-CM

## 2017-01-30 LAB — BASIC METABOLIC PANEL
ANION GAP: 5 (ref 5–15)
BUN: <5 mg/dL — ABNORMAL LOW (ref 6–20)
CALCIUM: 7.6 mg/dL — AB (ref 8.9–10.3)
CO2: 26 mmol/L (ref 22–32)
Chloride: 102 mmol/L (ref 101–111)
Creatinine, Ser: 0.51 mg/dL — ABNORMAL LOW (ref 0.61–1.24)
Glucose, Bld: 132 mg/dL — ABNORMAL HIGH (ref 65–99)
Potassium: 3.5 mmol/L (ref 3.5–5.1)
SODIUM: 133 mmol/L — AB (ref 135–145)

## 2017-01-30 LAB — CBC
HCT: 23.2 % — ABNORMAL LOW (ref 39.0–52.0)
HEMATOCRIT: 22.9 % — AB (ref 39.0–52.0)
HEMOGLOBIN: 8.3 g/dL — AB (ref 13.0–17.0)
HEMOGLOBIN: 8.5 g/dL — AB (ref 13.0–17.0)
MCH: 33.6 pg (ref 26.0–34.0)
MCH: 33.7 pg (ref 26.0–34.0)
MCHC: 36.2 g/dL — ABNORMAL HIGH (ref 30.0–36.0)
MCHC: 36.6 g/dL — AB (ref 30.0–36.0)
MCV: 92.7 fL (ref 78.0–100.0)
MCV: 92.1 fL (ref 78.0–100.0)
Platelets: 74 10*3/uL — ABNORMAL LOW (ref 150–400)
Platelets: 89 10*3/uL — ABNORMAL LOW (ref 150–400)
RBC: 2.47 MIL/uL — ABNORMAL LOW (ref 4.22–5.81)
RBC: 2.52 MIL/uL — ABNORMAL LOW (ref 4.22–5.81)
RDW: 16.8 % — ABNORMAL HIGH (ref 11.5–15.5)
RDW: 17.1 % — ABNORMAL HIGH (ref 11.5–15.5)
WBC: 6.6 10*3/uL (ref 4.0–10.5)
WBC: 7 10*3/uL (ref 4.0–10.5)

## 2017-01-30 LAB — PREPARE RBC (CROSSMATCH)

## 2017-01-30 MED ORDER — SODIUM CHLORIDE 0.9 % IV SOLN
Freq: Once | INTRAVENOUS | Status: AC
  Administered 2017-01-30: 06:00:00 via INTRAVENOUS

## 2017-01-30 MED ORDER — SODIUM CHLORIDE 0.9 % IV BOLUS (SEPSIS)
500.0000 mL | Freq: Once | INTRAVENOUS | Status: AC
  Administered 2017-01-30: 500 mL via INTRAVENOUS

## 2017-01-30 NOTE — Evaluation (Signed)
Physical Therapy Evaluation Patient Details Name: Malik Lambert MRN: [id number] DOB: January 05, 1966 Today's Date: 01/30/2017   History of Present Illness   51 y.o.-year-old male who sustained a right hip fracture. ORIF 01/29/17 with PWB (50%). 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  The pt has Aortic Valve replacement in May of 2017.   Clinical Impression  Pt s/p IM nailing of R hip fx and presents with functional mobility limitations 2* decreased R LE strength/ROM, post op pain and PWB status on R LE.  Pt is motivated and hopes to dc home with HHPT and follow up HHPT.    Follow Up Recommendations Home health PT;SNF;Supervision/Assistance - 24 hour (dependent on acute stay progress and assist level at home)    Equipment Recommendations  Rolling walker with 5" wheels    Recommendations for Other Services OT consult     Precautions / Restrictions Precautions Precautions: Fall Restrictions Weight Bearing Restrictions: Yes RUE Weight Bearing: Partial weight bearing RUE Partial Weight Bearing Percentage or Pounds: 50 RLE Weight Bearing: Partial weight bearing RLE Partial Weight Bearing Percentage or Pounds: 50%      Mobility  Bed Mobility Overal bed mobility: Needs Assistance Bed Mobility: Supine to Sit     Supine to sit: Min assist;+2 for physical assistance;HOB elevated     General bed mobility comments: cues for sequence and use of L LE to self assist  Transfers Overall transfer level: Needs assistance Equipment used: Rolling walker (2 wheeled) Transfers: Sit to/from [facility] Sit to Stand: Min assist;+2 physical assistance Stand pivot transfers: Min assist;+2 safety/equipment       General transfer comment: cues for LE management and use of UEs to self assist  Ambulation/Gait Ambulation/Gait assistance: Min assist;+2 physical assistance;+2 safety/equipment Ambulation Distance (Feet): 2 Feet Assistive device: Rolling walker (2  wheeled) Gait Pattern/deviations: Step-to pattern;Decreased step length - right;Decreased step length - left;Decreased stance time - right;Shuffle Gait velocity: decr Gait velocity interpretation: Below normal speed for age/gender General Gait Details: cues for sequence, posture and position from RW.  Pt demonstrating good follow through with [facility]  Stairs            Wheelchair Mobility    Modified Rankin (Stroke Patients Only)       Balance Overall balance assessment: Needs assistance   Sitting balance-Leahy Scale: Good       Standing balance-Leahy Scale: Poor                               Pertinent Vitals/Pain Pain Assessment: 0-10 Pain Score: 8  Pain Location: Rhip Pain Descriptors / Indicators: Aching;Discomfort;Grimacing Pain Intervention(s): Limited activity within patient's tolerance;Monitored during session;Premedicated before session;Ice applied;RN gave pain meds during session    Kathryn expects to be discharged to:: Private residence Living Arrangements: Alone Available Help at Discharge: Family;Available 24 hours/day Type of Home: House Home Access: Stairs to enter Entrance Stairs-Rails: None Entrance Stairs-Number of Steps: 1 Home Layout: Two level;Bed/bath upstairs;1/2 bath on main level Home Equipment: None Additional Comments: 51 yr old dtr can likely assist    Prior Function Level of Independence: Independent         Comments: 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     Journalist, newspaper   Dominant Hand: Right    Extremity/Trunk Assessment   Upper Extremity Assessment Upper Extremity Assessment: Overall WFL for tasks assessed    Lower Extremity Assessment Lower Extremity  Assessment: RLE deficits/detail RLE Deficits / Details: complaints of RLE pain with minimal movement    Cervical / Trunk Assessment Cervical / Trunk Assessment: Normal  Communication   Communication: No difficulties  Cognition  Arousal/Alertness: Awake/alert Behavior During Therapy: Anxious Overall Cognitive Status: Within Functional Limits for tasks assessed                                        General Comments      Exercises     Assessment/Plan    PT Assessment Patient needs continued PT services  PT Problem List Decreased strength;Decreased range of motion;Decreased activity tolerance;Decreased balance;Decreased mobility;Decreased knowledge of use of DME;Pain       PT Treatment Interventions DME instruction;Gait training;Stair training;Functional mobility training;Therapeutic activities;Therapeutic exercise;Patient/family education    PT Goals (Current goals can be found in the Care Plan section)  Acute Rehab PT Goals Patient Stated Goal: to get back to work PT Goal Formulation: With patient Time For Goal Achievement: 02/13/17 Potential to Achieve Goals: Good    Frequency 7X/week   Barriers to discharge        Co-evaluation PT/OT/SLP Co-Evaluation/Treatment: Yes Reason for Co-Treatment: For patient/therapist safety PT goals addressed during session: Mobility/safety with mobility OT goals addressed during session: ADL's and self-care       AM-PAC PT "6 Clicks" Daily Activity  Outcome Measure Difficulty turning over in bed (including adjusting bedclothes, sheets and blankets)?: Total Difficulty moving from lying on back to sitting on the side of the bed? : Total Difficulty sitting down on and standing up from a chair with arms (e.g., wheelchair, bedside commode, etc,.)?: Total Help needed moving to and from a bed to chair (including a wheelchair)?: A Lot Help needed walking in hospital room?: A Lot Help needed climbing 3-5 steps with a railing? : A Lot 6 Click Score: 9    End of Session Equipment Utilized During Treatment: Gait belt Activity Tolerance: Patient limited by pain Patient left: in chair;with call bell/phone within reach;with family/visitor present Nurse  Communication: Mobility status PT Visit Diagnosis: Difficulty in walking, not elsewhere classified (R26.2)    Time: 9518-8416 PT Time Calculation (min) (ACUTE ONLY): 32 min   Charges:   PT Evaluation $PT Eval Low Complexity: 1 Procedure     PT G Codes:        Pg [phone]   Keandria Berrocal 01/30/2017, 10:00 AM

## 2017-01-30 NOTE — Progress Notes (Signed)
Progress Note  Patient Name: Malik Lambert Date of Encounter: 01/30/2017  Primary Cardiologist: Dr. Curt Bears  Subjective   Doing well this morning, some post op pain. No CP. No atrial fib/flutter overnight  Inpatient Medications    Scheduled Meds: . chlordiazePOXIDE  15 mg Oral BID  . fluticasone  2 spray Each Nare Daily  . folic acid  1 mg Oral Daily  . LORazepam  0-4 mg Intravenous Q12H  . metoprolol tartrate  50 mg Oral BID  . multivitamin with minerals  1 tablet Oral Daily  . pravastatin  20 mg Oral QHS  . thiamine  100 mg Oral Daily   Or  . thiamine  100 mg Intravenous Daily   Continuous Infusions: .  ceFAZolin (ANCEF) IV Stopped (01/30/17 0538)  . methocarbamol (ROBAXIN)  IV 500 mg (01/29/17 1922)   PRN Meds: acetaminophen **OR** acetaminophen, albuterol, fentaNYL (SUBLIMAZE) injection, HYDROcodone-acetaminophen, LORazepam **OR** LORazepam, methocarbamol **OR** methocarbamol (ROBAXIN)  IV, metoCLOPramide **OR** metoCLOPramide (REGLAN) injection, metoprolol tartrate, ondansetron (ZOFRAN) IV, ondansetron **OR** ondansetron (ZOFRAN) IV   Vital Signs    Vitals:   01/30/17 0315 01/30/17 0600 01/30/17 0631 01/30/17 0700  BP:    109/71  Pulse:   (!) 104   Resp:    14  Temp: 99.1 F (37.3 C) 97.9 F (36.6 C) 99.6 F (37.6 C)   TempSrc: Axillary Oral Oral   SpO2:    100%  Weight:      Height:        Intake/Output Summary (Last 24 hours) at 01/30/17 0821 Last data filed at 01/30/17 0600  Gross per 24 hour  Intake             1750 ml  Output             2025 ml  Net             -275 ml   Filed Weights   01/27/17 1707 01/28/17 0502  Weight: 152 lb (68.9 kg) 155 lb 6.8 oz (70.5 kg)    Telemetry    SR to ST with occasional PVCs,  - Personally Reviewed  ECG    Last one 01/27/17. No new - Personally Reviewed   Physical Exam   GEN: Well nourished, well developed HEENT: normal  Neck: no JVD, carotid bruits, or masses Cardiac: RRR.  Respiratory: clear  to auscultation bilaterally, normal work of breathing GI: soft, nontender, nondistended, + BS MS: right hip repair Skin: warm and dry, no rash Neuro: Alert and Oriented x 3, Strength and sensation are intact Psych:   Full affect  Labs    Chemistry Recent Labs Lab 01/28/17 0345 01/28/17 0853 01/29/17 0407 01/30/17 0307  NA 130*  --  132* 133*  K 4.1  --  3.7 3.5  CL 102  --  102 102  CO2 21*  --  24 26  GLUCOSE 106*  --  102* 132*  BUN 8  --  <5* <5*  CREATININE 0.70  --  0.49* 0.51*  CALCIUM 7.6*  --  8.0* 7.6*  PROT  --  5.3* 5.0*  --   ALBUMIN  --  3.0* 2.8*  --   AST  --  52* 43*  --   ALT  --  26 25  --   ALKPHOS  --  37* 39  --   BILITOT  --  0.6 1.1  --   GFRNONAA >60  --  >60 >60  GFRAA >60  --  >60 >  60  ANIONGAP 7  --  6 5     Hematology Recent Labs Lab 01/28/17 1904 01/29/17 0407 01/30/17 0307  WBC 8.5 6.1 5.0  RBC 2.80* 2.48* 1.94*  HGB 9.4* 8.6* 6.7*  HCT 26.1* 23.3* 18.5*  MCV 93.2 94.0 95.4  MCH 33.6 34.7* 34.5*  MCHC 36.0 36.9* 36.2*  RDW 15.6* 15.7* 15.3  PLT 74* 70* 68*    Cardiac EnzymesNo results for input(s): TROPONINI in the last 168 hours. No results for input(s): TROPIPOC in the last 168 hours.   BNPNo results for input(s): BNP, PROBNP in the last 168 hours.   DDimer No results for input(s): DDIMER in the last 168 hours.   Radiology    Dg C-arm 1-60 Min-no Report  Result Date: 01/29/2017 Fluoroscopy was utilized by the requesting physician.  No radiographic interpretation.   Dg Hip Operative Unilat With Pelvis Right  Result Date: 01/29/2017 CLINICAL DATA:  51 year old male with right intratrochanteric fracture. Subsequent encounter. EXAM: OPERATIVE right HIP (WITH PELVIS IF PERFORMED) 6 VIEWS TECHNIQUE: Fluoroscopic spot image(s) were submitted for interpretation post-operatively. COMPARISON:  01/27/2017. FINDINGS: Right femoral intramedullary rod with proximal sliding type screw and mid to distal fixation screw transfix right  intertrochanteric fracture without complication identified. This can be assessed on follow-up. IMPRESSION: Open reduction and internal fixation right intertrochanteric fracture. Electronically Signed   By: Genia Del M.D.   On: 01/29/2017 19:13   US Abdomen Limited Ruq  Result Date: 01/28/2017 CLINICAL DATA:  All volvulus EXAM: ULTRASOUND ABDOMEN LIMITED RIGHT UPPER QUADRANT COMPARISON:  None. FINDINGS: Gallbladder: No gallstones or wall thickening visualized. No sonographic Murphy sign noted by sonographer. Common bile duct: Diameter: Normal at 4 mm Liver: Increased liver echogenicity compared to the kidneys. No duct dilatation. No focal lesion. IMPRESSION: 1. Increased liver echogenicity commonly represents hepatic steatosis. Finding could indicate hepatocellular disease (cirrhosis). 2. Normal gallbladder.  No biliary duct dilatation. Electronically Signed   By: Suzy Bouchard M.D.   On: 01/28/2017 11:48    Cardiac Studies    Echo 04/14/16: 2017 with EF 55%, Lt atrium markedly dilated, mild AR and Mod. MR.   TEE echo 10/29/15 EF 65-70% mod. LA enlargement. Mild LVH.  Patient Profile     51 y.o. male with a hx of Heavy alcohol use, atrial flutter ablation 2, bioprosthetic aortic valve replacementwho is being followed by Cardiology for the evaluation of atrial flutterat the request of Dr Candiss Norse. Also for risk eval for surgery intramedullary nail intertrochantric placed on Rt for today.    Assessment & Plan    Hx of atrial flutter with ablation. Second ablation 05/2016, successful. - chronic ETOH abuse, sever anemia and decreasing platelet count. CHADVASC  1   ( HTN/1). Is at stroke risk if a flutter were to return. - maintaining ST to SR at 93, with occ PVC  - Metoprolol decreased (6/25) hypotension - IV amiodarone stopped (6/25)  Bioprosthetic aortic valve (Dr. Jerelene Redden, [facility]) - echo 04/2016, EF 55%, markedly dilated LA mod MR  Rt hip fracture - low/mod risk, had repair  yesterday by Dr. Stann Mainland  ETOH abuse   Anemia: admission hgb 9.4 has decreased to 6.7 post operatively. plt 68 - medicine team to manage.   Signed, Linus Mako, PA-C  01/30/2017, 8:21 AM     Attending Note:   The patient was seen and examined.  Agree with assessment and plan as noted above.  Changes made to the above note as needed.  Patient seen and independently  examined with Delos Haring, PA .   We discussed all aspects of the encounter. I agree with the assessment and plan as stated above.  1.   S/p AVR :   Stable   2.   Hx of Atrial flutter - s/p ablation  Stable Needs to be restarted on his Eliquis as soon as Ortho gives the OK He will need Eliquis 5 mg BID for his atrial flutter.   3.  Sinus tach . Metoprolol has been on hold due to hypotension I suspect this is due to the fact that he has not eaten regularly . Would anticipate restarting tomorrow .     I have spent a total of 30 minutes with patient reviewing hospital  notes , telemetry, EKGs, labs and examining patient as well as establishing an assessment and plan that was discussed with the patient. > 50% of time was spent in direct patient care.  Will sign off. Call for questions   Ramond Dial., MD, [facility] 01/30/2017, 10:27 AM [address],  [place] Pager [phone]

## 2017-01-30 NOTE — Progress Notes (Signed)
PROGRESS NOTE  Malik Lambert  OAC:[id number] DOB: [birth year] DOA: 01/27/2017 PCP: Secundino Ginger, PA-C   Brief Narrative: Malik Lambert is a 51 y.o. 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 He displayed evidence of alcohol withdrawal at admission, started on librium and CIWA. Flutter waves initially noted, so amiodarone was started in addition to ~3L IVF's given for hypotension and the patient was admitted to SDU. Surgery was delayed due to anemia and thrombocytopenia requiring transfusions and to allow eliquis washout (last taken 6/22). IM nail was placed 6/25 with postoperative anemia, transfusion has been reordered.   Assessment & Plan: Principal Problem:   Displaced intertrochanteric fracture of right femur, initial encounter for closed fracture [facility]) Active Problems:   Atrial flutter ([place])   Aortic stenosis   Hypertension, essential   Mixed hyperlipidemia   PAD (peripheral artery disease) ([facility])   GERD (gastroesophageal reflux disease)   Alcohol abuse   Closed displaced intertrochanteric fracture of right femur ([facility])   H/O aortic valve replacement  Mechanical fall with right intertrochanteric hip fracture: s/p IM nail 6/25 by Dr. Stann Mainland.  - Fentanyl for pain control to minimize hypotensive response. - Weight bearing per orthopedics  Alcohol abuse with evidence of withdrawal at admission: Longterm drinking 6-8 beers nightly. Last drink was 6/22 PM. Abd U/S demonstrated steatosis vs. cirrhosis.  - Cessation counseling provided - Librium taper, final dose today - CIWA - Continue thiamine, MVM.  Paroxysmal atrial flutter s/p ablation: Currently in sinus tachycardia persistently with occasional PVCs. TSH wnl.  - Appreciate  cardiology assistance. On metoprolol and prn beta blocker ordered.  - Holding eliquis and ASA. Will need discussion of risks/benefits of these with thrombocytopenia, alcohol use, and h/o ablation.   Normocytic anemia: Iron studies indicate deficiency, and suspect significant hemodilutional component in addition to acute blood loss related to fracture. 2u PRBCs 6/24 with increase hgb 6.9 > 8.6 > 6.8 post-op.  - Transfusion being given now, recheck CBC in 2 hours and transfuse as necessary. Anticipate improvement in BP.  - Will give iron supplement during recovery period - Monitor I/O's carefully to avoid overload  Thrombocytopenia: Plt <50k and surgery planned. No evidence of impaired hepatic synthetic function with normal INR, and no schistocytes noted on smear. Plt's improved to 70 with 1u transfusion 6/24.  - Monitor.   HTN: Initially hypotensive. AM cortisol ok at 14.  - Monitor on half dose metoprolol and holding lisinopril.   PAD: No critical ischemia noted - Continue statin, holding ASA/anticoagulation  Hyponatremia: Mild, improving, suspect potomania, serum osm slightly low at 269. - Continue IVF's and monitor.  DVT prophylaxis: SCDs Code Status: Full Family Communication: Girlfriend at bedside. Disposition Plan: Continue SDU, consider transfer to orthopedics later today if BP stable.   Consultants:   Orthopedics, Dr. Stann Mainland  Cardiology, Dr. Marlou Porch  Procedures:  01/29/2017 by Dr. Stann Mainland: INTRAMEDULLARY (IM) NAIL INTERTROCHANTRIC: Treatment of intertrochanteric, pertrochanteric, subtrochanteric fracture with intramedullary implant.  Antimicrobials:  Ancef perioperatively 6/25  Subjective: Pain controlled, no other complaints. Got up with therapy this AM. Denies chest pain, dyspnea, palpitations.   Objective: Vitals:   01/30/17 0800 01/30/17 0829 01/30/17 0852 01/30/17 0900  BP: (!) 81/34 (!) 92/50 (!) 96/55 (!) 96/55  Pulse:   (!) 113  Resp: 17 (!) 23 18 20   Temp:  98 F (36.7 C)  98.2 F (36.8 C)   TempSrc: Axillary  Oral   SpO2: 95% 97% 99% 98%  Weight:      Height:        Intake/Output Summary (Last 24 hours) at 01/30/17 0947 Last data filed at 01/30/17 5465  Gross per 24 hour  Intake             2080 ml  Output             2175 ml  Net              -95 ml   Filed Weights   01/27/17 1707 01/28/17 0502  Weight: 68.9 kg (152 lb) 70.5 kg (155 lb 6.8 oz)    Examination: General exam: 51 y.o. male in no distress  Respiratory system: Non-labored breathing room air. Clear to auscultation bilaterally.  Cardiovascular system: Regular tachycardia. II/VI early systolic blowing murmur at RUSB, rub, or gallop. No JVD, and no pedal edema. Gastrointestinal system: Abdomen soft, non-tender, non-distended, with normoactive bowel sounds. No organomegaly or masses felt. Central nervous system: Alert and oriented. No focal neurological deficits. Extremities: RLE with normal alignment, compartment soft. Dressing on lateral right hip c/d/i. SILT and motor function 5/5 in feet. Cap refill brisk throughout.  Skin: No rashes, lesions no ulcers Psychiatry: Judgement and insight appear normal. Mood & affect appropriate.   CBC:  Recent Labs Lab 01/27/17 1437 01/27/17 1928 01/28/17 0345 01/28/17 1904 01/29/17 0407 01/30/17 0307  WBC 11.6*  --  6.8 8.5 6.1 5.0  NEUTROABS 8.5*  --  4.4 5.9  --   --   HGB 9.0* 7.5* 6.9* 9.4* 8.6* 6.7*  HCT 23.9* 20.1* 18.7* 26.1* 23.3* 18.5*  MCV 98.0  --  98.9 93.2 94.0 95.4  PLT 75*  --  47* 74* 70* 68*   Basic Metabolic Panel:  Recent Labs Lab 01/27/17 1437 01/28/17 0345 01/29/17 0407 01/30/17 0307  NA 128* 130* 132* 133*  K 4.9 4.1 3.7 3.5  CL 97* 102 102 102  CO2 16* 21* 24 26  GLUCOSE 99 106* 102* 132*  BUN 9 8 <5* <5*  CREATININE 0.87 0.70 0.49* 0.51*  CALCIUM 8.3* 7.6* 8.0* 7.6*   GFR: Estimated Creatinine Clearance: 97.5 mL/min (A) (by C-G formula based on SCr of 0.51 mg/dL (L)).  Coagulation  Profile:  Recent Labs Lab 01/27/17 1437  INR 1.12   Thyroid Function Tests:  Recent Labs  01/27/17 1735  TSH 3.969   Anemia Panel:  Recent Labs  01/27/17 1735  VITAMINB12 231  FOLATE 17.0  FERRITIN 1,249*  TIBC 190*  IRON 32*  RETICCTPCT 2.5   Radiology Studies: Dg C-arm 1-60 Min-no Report  Result Date: 01/29/2017 Fluoroscopy was utilized by the requesting physician.  No radiographic interpretation.   Dg Hip Operative Unilat With Pelvis Right  Result Date: 01/29/2017 CLINICAL DATA:  51 year old male with right intratrochanteric fracture. Subsequent encounter. EXAM: OPERATIVE right HIP (WITH PELVIS IF PERFORMED) 6 VIEWS TECHNIQUE: Fluoroscopic spot image(s) were submitted for interpretation post-operatively. COMPARISON:  01/27/2017. FINDINGS: Right femoral intramedullary rod with proximal sliding type screw and mid to distal fixation screw transfix right intertrochanteric fracture without complication identified. This can be assessed on follow-up. IMPRESSION: Open reduction and internal fixation right intertrochanteric fracture. Electronically Signed   By: Genia Del M.D.   On: 01/29/2017 19:13   US Abdomen Limited Ruq  Result Date: 01/28/2017 CLINICAL DATA:  All  volvulus EXAM: ULTRASOUND ABDOMEN LIMITED RIGHT UPPER QUADRANT COMPARISON:  None. FINDINGS: Gallbladder: No gallstones or wall thickening visualized. No sonographic Murphy sign noted by sonographer. Common bile duct: Diameter: Normal at 4 mm Liver: Increased liver echogenicity compared to the kidneys. No duct dilatation. No focal lesion. IMPRESSION: 1. Increased liver echogenicity commonly represents hepatic steatosis. Finding could indicate hepatocellular disease (cirrhosis). 2. Normal gallbladder.  No biliary duct dilatation. Electronically Signed   By: Suzy Bouchard M.D.   On: 01/28/2017 11:48    LOS: 3 days   Time spent: 25 minutes.  Vance Gather, MD [facility] Pager [phone]  If 7PM-7AM,  please contact night-coverage [email or web address] Password [facility] 01/30/2017, 9:47 AM

## 2017-01-30 NOTE — Progress Notes (Signed)
Occupational Therapy Evaluation Patient Details Name: Malik Lambert MRN: [id number] DOB: [birth year] Today's Date: 01/30/2017    History of Present Illness  51 y.o.-year-old male who sustained a right hip fracture. ORIF 01/29/17 with PWB (50%). 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  The pt has Aortic Valve replacement in May of 2017.    Clinical Impression   PTA, pt lived at [place] alone and worked full time as a English as a second language teacher. Pt currently requires [employer] a with mobility and mod A with ADL. Pt anxious during session. Premedicated. HR sustained 151 during mobility. All other VSS. If pt continues to progress well, anticipate pt will be able to DC home, but will need caregiver support (unsure if daughter able to provide this level of support). Will follow acutely to maximize functional level of independence to facilitate safe DC to next venue of care.     Follow Up Recommendations  Supervision/Assistance - 24 hour;Other (comment) (pending progress) HHOT vs SNF   Equipment Recommendations  3 in 1 bedside commode;Other (comment);Tub/shower bench (RW) May need hospital bed   Recommendations for Other Services       Precautions / Restrictions Precautions Precautions: Fall Restrictions Weight Bearing Restrictions: Yes RUE Weight Bearing: Partial weight bearing RUE Partial Weight Bearing Percentage or Pounds: 50 RLE Weight Bearing: Partial weight bearing RLE Partial Weight Bearing Percentage or Pounds: 50      Mobility Bed Mobility Overal bed mobility: Needs Assistance Bed Mobility: Supine to Sit     Supine to sit: Min assist;+2 for physical assistance;HOB elevated        Transfers Overall transfer level: Needs assistance Equipment used: Rolling walker (2 wheeled) Transfers: Sit to/from [employer] Sit to Stand: Min assist;+2 physical assistance Stand pivot transfers: Min assist;+2 safety/equipment            Balance Overall balance assessment:  Needs assistance   Sitting balance-Leahy Scale: Good       Standing balance-Leahy Scale: Poor                             ADL either performed or assessed with clinical judgement   ADL Overall ADL's : Needs assistance/impaired     Grooming: Set up;Sitting   Upper Body Bathing: Set up;Sitting   Lower Body Bathing: Moderate assistance;Sit to/from stand   Upper Body Dressing : Set up;Sitting   Lower Body Dressing: Moderate assistance;Sit to/from stand   Toilet Transfer: Minimal assistance;+2 for safety/equipment;RW (simulated)   Toileting- Clothing Manipulation and Hygiene: Maximal assistance Toileting - Clothing Manipulation Details (indicate cue type and reason): foley     Functional mobility during ADLs: Minimal assistance;+2 for physical assistance;Rolling walker;Cueing for safety       Vision Baseline Vision/History: Wears glasses Patient Visual Report: No change from baseline Vision Assessment?: No apparent visual deficits     Perception     Praxis      Pertinent Vitals/Pain Pain Assessment: 0-10 Pain Score: 8  Pain Location: Rhip Pain Descriptors / Indicators: Aching;Discomfort;Grimacing     Hand Dominance Right   Extremity/Trunk Assessment Upper Extremity Assessment Upper Extremity Assessment: Overall WFL for tasks assessed   Lower Extremity Assessment Lower Extremity Assessment: RLE deficits/detail;Defer to PT evaluation RLE Deficits / Details: complaints of RLE pain with minimal movement   Cervical / Trunk Assessment Cervical / Trunk Assessment: Normal   Communication Communication Communication: No difficulties   Cognition Arousal/Alertness: Awake/alert Behavior During Therapy: Anxious  Overall Cognitive Status: Within Functional Limits for tasks assessed                                     General Comments       Exercises     Shoulder Instructions      Home Living Family/patient expects to be discharged  to:: Private residence   Available Help at Discharge: Family;Available 24 hours/day (for 1 week) Type of Home: House Home Access: Stairs to enter [employer] of Steps: 1 (through garage) Entrance Stairs-Rails: None Home Layout: Two level;Bed/bath upstairs;1/2 bath on main level Alternate Level Stairs-Number of Steps: flight Alternate Level Stairs-Rails: Right;Left;Can reach both Bathroom Shower/Tub: Tub/shower unit;Curtain   Biochemist, clinical: Standard Bathroom Accessibility: Yes How Accessible: Accessible via walker Home Equipment: None          Prior Functioning/Environment Level of Independence: Independent        Comments: works as a Tax adviser Problem List: Decreased strength;Decreased range of motion;Decreased activity tolerance;Impaired balance (sitting and/or standing);Decreased safety awareness;Decreased knowledge of use of DME or AE;Decreased knowledge of precautions;Cardiopulmonary status limiting activity;Pain      OT Treatment/Interventions: Self-care/ADL training;DME and/or AE instruction;Therapeutic activities;Energy conservation;Patient/family education;Balance training    OT Goals(Current goals can be found in the care plan section) Acute Rehab OT Goals Patient Stated Goal: to get back to work OT Goal Formulation: With patient Time For Goal Achievement: 02/13/17 Potential to Achieve Goals: Good ADL Goals Pt Will Perform Lower Body Bathing: with set-up;with supervision;with adaptive equipment;sit to/from stand Pt Will Perform Lower Body Dressing: with set-up;with supervision;sit to/from stand;with adaptive equipment Pt Will Transfer to Toilet: with supervision;ambulating;bedside commode Pt Will Perform Toileting - Clothing Manipulation and hygiene: with modified independence;sitting/lateral leans;sit to/from stand  OT Frequency: Min 3X/week   Barriers to D/C:            Co-evaluation PT/OT/SLP Co-Evaluation/Treatment:  Yes Reason for Co-Treatment: For patient/therapist safety;To address functional/ADL transfers   OT goals addressed during session: ADL's and self-care;Other (comment) (functional mobility)      AM-PAC PT "6 Clicks" Daily Activity     Outcome Measure Help from another person eating meals?: None Help from another person taking care of personal grooming?: None Help from another person toileting, which includes using toliet, bedpan, or urinal?: A Lot Help from another person bathing (including washing, rinsing, drying)?: A Lot Help from another person to put on and taking off regular upper body clothing?: None Help from another person to put on and taking off regular lower body clothing?: A Lot 6 Click Score: 18   End of Session Equipment Utilized During Treatment: Gait belt;Rolling walker Nurse Communication: Mobility status;Weight bearing status  Activity Tolerance: Patient tolerated treatment well Patient left: in chair;with call bell/phone within reach;with family/visitor present  OT Visit Diagnosis: Unsteadiness on feet (R26.81);Pain Pain - Right/Left: Right Pain - part of body: Hip                Time: 6387-5643 OT Time Calculation (min): 38 min Charges:  OT General Charges $OT Visit: 1 Procedure OT Evaluation $OT Eval Moderate Complexity: 1 Procedure G-Codes:     Cillian Gwinner, OT/L  [phone] 01/30/2017  Mackenna Kamer,H[place]ARY 01/30/2017, 9:40 AM

## 2017-01-30 NOTE — Progress Notes (Signed)
Subjective: 1 Day Post-Op Procedure(s) (LRB): INTRAMEDULLARY (IM) NAIL INTERTROCHANTRIC,ARTHROCENITIS RIGHT KNEE (Right) Patient reports pain as well controlled.  Right hip and leg pain. tolerating Po's. Progressing with PT. Denies CP, SOB, or calf pain. Daughter at bedside.   Objective: Vital signs in last 24 hours: Temp:  [97.9 F (36.6 C)-99.7 F (37.6 C)] 98.7 F (37.1 C) (06/26 1200) Pulse Rate:  [104-122] 113 (06/26 0852) Resp:  [13-23] 16 (06/26 1100) BP: (79-143)/(34-99) 100/79 (06/26 1100) SpO2:  [95 %-100 %] 96 % (06/26 1000)  Intake/Output from previous day: 06/25 0701 - 06/26 0700 In: 1750 [I.V.:1650; IV Piggyback:100] Out: 2025 [Urine:1950; Blood:75] Intake/Output this shift: Total I/O In: 330 [Blood:330] Out: 375 [Urine:375]   Recent Labs  01/28/17 0345 01/28/17 1904 01/29/17 0407 01/30/17 0307 01/30/17 1124  HGB 6.9* 9.4* 8.6* 6.7* 8.3*    Recent Labs  01/30/17 0307 01/30/17 1124  WBC 5.0 6.6  RBC 1.94* 2.47*  HCT 18.5* 22.9*  PLT 68* 74*    Recent Labs  01/29/17 0407 01/30/17 0307  NA 132* 133*  K 3.7 3.5  CL 102 102  CO2 24 26  BUN <5* <5*  CREATININE 0.49* 0.51*  GLUCOSE 102* 132*  CALCIUM 8.0* 7.6*   No results for input(s): LABPT, INR in the last 72 hours.  Well nourished. Alert and oriented x3. Right Calf soft and non tender. Right leg dressings C/D/I. No DVT signs. Compartment soft. No signs of infection.  Right LE grossly neurovascular intact. right knee effusion. Plantar and dorsi flexion intact. 2+ pedal pulse.   Assessment/Plan: 1 Day Post-Op Procedure(s) (LRB): INTRAMEDULLARY (IM) NAIL INTERTROCHANTRIC,ARTHROCENITIS RIGHT KNEE (Right) PT/OT Advance diet Discharge planning, possible SNF Pain management RLE 50% WB  Right knee effusion: Xray today Modified WB Monitor  Post-op anemia: Monitor labs Asymptomatic   Thrombocytopenia: Improving Continue to monitor Holding Eliquis 5mg  bid per medicine, ok to restart  from Ortho standpoint when ready SCDS   Davyd Podgorski L 01/30/2017, 2:46 PM

## 2017-01-30 NOTE — Progress Notes (Signed)
Called by RN regarding BRBPR which the patient reportedly experiences intermittently. He was planning for screening colonoscopy later this year, but can't recall with whom this was scheduled. Due to ongoing anemia and lower GI bleeding, I have asked for GI consultation.   Vance Gather, MD 01/30/2017 4:51 PM

## 2017-01-30 NOTE — Progress Notes (Signed)
CRITICAL VALUE ALERT  Critical Value:hgb 6.7  Date & Time Notied:  01/30/17 0400  Provider Notified: yes  Orders Received/Actions taken:yes

## 2017-01-31 DIAGNOSIS — K922 Gastrointestinal hemorrhage, unspecified: Secondary | ICD-10-CM

## 2017-01-31 LAB — BPAM RBC
BLOOD PRODUCT EXPIRATION DATE: 201807112359
BLOOD PRODUCT EXPIRATION DATE: 201807192359
Blood Product Expiration Date: 201807192359
ISSUE DATE / TIME: 201806241130
ISSUE DATE / TIME: 201806260603
ISSUE DATE / TIME: 201806240849
UNIT TYPE AND RH: 5100
UNIT TYPE AND RH: 5100
Unit Type and Rh: 5100

## 2017-01-31 LAB — CBC
HCT: 18.5 % — ABNORMAL LOW (ref 39.0–52.0)
HCT: 22 % — ABNORMAL LOW (ref 39.0–52.0)
Hemoglobin: 6.7 g/dL — CL (ref 13.0–17.0)
Hemoglobin: 8 g/dL — ABNORMAL LOW (ref 13.0–17.0)
MCH: 34.5 pg — ABNORMAL HIGH (ref 26.0–34.0)
MCH: 33.2 pg (ref 26.0–34.0)
MCHC: 36.2 g/dL — ABNORMAL HIGH (ref 30.0–36.0)
MCHC: 36.4 g/dL — ABNORMAL HIGH (ref 30.0–36.0)
MCV: 95.4 fL (ref 78.0–100.0)
MCV: 91.3 fL (ref 78.0–100.0)
PLATELETS: 95 10*3/uL — AB (ref 150–400)
Platelets: 68 10*3/uL — ABNORMAL LOW (ref 150–400)
RBC: 1.94 MIL/uL — ABNORMAL LOW (ref 4.22–5.81)
RBC: 2.41 MIL/uL — ABNORMAL LOW (ref 4.22–5.81)
RDW: 15.3 % (ref 11.5–15.5)
RDW: 16.9 % — AB (ref 11.5–15.5)
WBC: 5 10*3/uL (ref 4.0–10.5)
WBC: 6.2 10*3/uL (ref 4.0–10.5)

## 2017-01-31 LAB — TYPE AND SCREEN
ABO/RH(D): O POS
ANTIBODY SCREEN: NEGATIVE
Unit division: 0
Unit division: 0
Unit division: 0

## 2017-01-31 MED ORDER — HEPARIN SOD (PORK) LOCK FLUSH 100 UNIT/ML IV SOLN: 500.0000 [IU] | INTRAVENOUS | Status: DC | PRN

## 2017-01-31 MED ORDER — OXYCODONE HCL 5 MG PO TABS: 5.0000 mg | ORAL_TABLET | ORAL | 0 refills | Status: DC | PRN

## 2017-01-31 NOTE — Clinical Social Work Note (Signed)
Clinical Social Work Assessment  Patient Details  Name: Malik Lambert MRN: [id number] Date of Birth: [birth year]  Date of referral:  01/31/17               Reason for consult:  Discharge Planning                Permission sought to share information with:    Permission granted to share information::     Name::        Agency::     Relationship::     Contact Information:     Housing/Transportation Living arrangements for the past 2 months:  Single Family Home Source of Information:  Patient Patient Interpreter Needed:  None Criminal Activity/Legal Involvement Pertinent to Current Situation/Hospitalization:  No - Comment as needed Significant Relationships:  Adult Children, Significant Other Lives with:    Do you feel safe going back to the place where you live?  Yes Need for family participation in patient care:  Yes (Comment)  Care giving concerns: No concerns reported by pt / girlfriend at this time.   Social Worker assessment / plan: Pt hospitalized from home on 01/27/17 with Displaced intertrochanteric fracture of right femur, initial encounter for closed fracture. Surgery has been completed. PT has recommended SNF ( depending on progression prior to d/c ). CSW met with pt / girlfriend at bedside to assist with d/c planning. Pt hopes to return home once stable. Pt was willing to accept SNF list but declined CSW's offer to initiate SNF search at this time. Pt / family will discuss this option and contact CSW with any questions. If pt does agree with [facility] Rehab option CSW will assist with [facility] authorization process. Employment status:  Therapist, music:  [employer]  PT Recommendations:  [place] / Referral to community resources:  [place]  Patient/Family's Response to care: Disposition to be determined.  Patient/Family's Understanding of and Emotional Response to Diagnosis, Current Treatment, and Prognosis: Pt is aware of his  medical status. Reports that he is feeling better. Pt is looking forward to feeling well enough to return home. Pt / girlfriend appreciate CSW offer of assistance with dc planning.  Emotional Assessment Appearance:  Appears stated age Attitude/Demeanor/Rapport:  Other (cooperative) Affect (typically observed):  Calm, Flat Orientation:  Oriented to Self, Oriented to Place, Oriented to  Time, Oriented to Situation Alcohol / Substance use:  Alcohol Use Psych involvement (Current and /or in the community):  No (Comment)  Discharge Needs  Concerns to be addressed:  Discharge Planning Concerns Readmission within the last 30 days:  No Current discharge risk:  None Barriers to Discharge:  No Barriers Identified   Luretha Rued, [place] 01/31/2017, 11:46 AM

## 2017-01-31 NOTE — Progress Notes (Signed)
Physical Therapy Treatment Patient Details Name: Malik Lambert MRN: [id number] DOB: 1965/09/23 Today's Date: 01/31/2017    History of Present Illness  51 y.o.-year-old male who sustained a right hip fracture. ORIF 01/29/17 with PWB (50%). 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  The pt has Aortic Valve replacement in May of 2017.     PT Comments    Pt motivated and progressing with mobility but ltd this am by HR elevated to 150s with activity.  RN aware.   Follow Up Recommendations  Home health PT;SNF;Supervision/Assistance - 24 hour     Equipment Recommendations  Rolling walker with 5" wheels    Recommendations for Other Services OT consult     Precautions / Restrictions Precautions Precautions: Fall Restrictions Weight Bearing Restrictions: Yes RLE Weight Bearing: Partial weight bearing RLE Partial Weight Bearing Percentage or Pounds: 50    Mobility  Bed Mobility Overal bed mobility: Needs Assistance Bed Mobility: Supine to Sit     Supine to sit: Min assist;HOB elevated     General bed mobility comments: cues for sequence and use of L LE to self assist  Transfers Overall transfer level: Needs assistance Equipment used: Rolling walker (2 wheeled) Transfers: Sit to/from Stand Sit to Stand: Min assist;+2 physical assistance         General transfer comment: cues for LE management and use of UEs to self assist  Ambulation/Gait Ambulation/Gait assistance: Min assist;+2 safety/equipment Ambulation Distance (Feet): 13 Feet Assistive device: Rolling walker (2 wheeled) Gait Pattern/deviations: Step-to pattern;Decreased step length - right;Decreased step length - left;Decreased stance time - right;Shuffle Gait velocity: decr   General Gait Details: cues for sequence, posture and position from RW.  Pt demonstrating good follow through with PWB.  Distance ltd by HR elevating into 150s   Stairs            Wheelchair Mobility    Modified  [place] (Stroke Patients Only)       Balance Overall balance assessment: Needs assistance   Sitting balance-Leahy Scale: Good       Standing balance-Leahy Scale: Poor                              Cognition Arousal/Alertness: Awake/alert Behavior During Therapy: Anxious Overall Cognitive Status: Within Functional Limits for tasks assessed                                        Exercises General Exercises - Lower Extremity Ankle Circles/Pumps: AROM;Both;15 reps;Supine Heel Slides: AAROM;Right;15 reps;Supine Hip ABduction/ADduction: AAROM;Right;15 reps;Supine    General Comments        Pertinent Vitals/Pain Pain Assessment: 0-10 Pain Score: 6  Pain Location: Rhip Pain Descriptors / Indicators: Aching;Discomfort;Grimacing Pain Intervention(s): Limited activity within patient's tolerance;Monitored during session;Premedicated before session;Ice applied    Home Living                      Prior Function            PT Goals (current goals can now be found in the care plan section) Acute Rehab PT Goals Patient Stated Goal: to get back to work PT Goal Formulation: With patient Time For Goal Achievement: 02/13/17 Potential to Achieve Goals: Good Progress towards PT goals: Progressing toward goals    Frequency    7X/week  PT Plan Current plan remains appropriate    Co-evaluation              AM-PAC PT "6 Clicks" Daily Activity  Outcome Measure  Difficulty turning over in bed (including adjusting bedclothes, sheets and blankets)?: Total Difficulty moving from lying on back to sitting on the side of the bed? : Total Difficulty sitting down on and standing up from a chair with arms (e.g., wheelchair, bedside commode, etc,.)?: Total Help needed moving to and from a bed to chair (including a wheelchair)?: A Lot Help needed walking in hospital room?: A Little Help needed climbing 3-5 steps with a railing? : A Lot 6  Click Score: 10    End of Session Equipment Utilized During Treatment: Gait belt Activity Tolerance: Patient tolerated treatment well;Other (comment) (HR elevated to 150s) Patient left: in chair;with call bell/phone within reach;with family/visitor present Nurse Communication: Mobility status PT Visit Diagnosis: Difficulty in walking, not elsewhere classified (R26.2)     Time: 9767-3419 PT Time Calculation (min) (ACUTE ONLY): 30 min  Charges:  $Gait Training: 8-22 mins $Therapeutic Exercise: 8-22 mins                    G Codes:      Pg [phone]    Jalayna Josten 01/31/2017, 12:18 PM

## 2017-01-31 NOTE — Consult Note (Addendum)
Referring Provider: Dr. Bonner Puna  Primary Care Physician:  Secundino Ginger, PA-C Primary Gastroenterologist:  Althia Forts  Reason for Consultation:  Rectal bleeding  HPI: Malik Lambert is a 51 y.o. male the past medical history of atrial flutter status post ablation currently on a Eliquis,  history of CVA aortic stenosis status post  bioprosthetic aortic valve replacement, straight of alcohol abuse admitted to the hospital with fall. Upon initial evaluation patient was found to have atrial flutter with RVR and right hip fracture. Yesterday patient started noticed bright blood per rectum. GI is consulted for further evaluation.  Patient seen and examined at bedside. Patient appears to be frustrated he has been having on and off rectal bleeding since last 2 years. He is scheduled to have colonoscopy done at [facility] in September of this year. He denied abdominal pain. He denied nausea or vomiting. No previous colonoscopy. No family history of colon cancer.  Past Medical History:  Diagnosis Date  . Acid reflux   . Alcohol abuse   . Anxiety disorder   . Aortic stenosis   . Aortic valve defect   . Ascending aortic aneurysm ([place])   . Atrial flutter ([place])   . Chronic constipation   . Dyspnea   . GERD (gastroesophageal reflux disease)   . Heart murmur   . Heart murmur   . High risk medication use   . Hypertension, essential   . LVH (left ventricular hypertrophy)   . Mixed hyperlipidemia   . PAD (peripheral artery disease) ([place])   . Rheumatic fever/heart disease   . Vitamin D deficiency     Past Surgical History:  Procedure Laterality Date  . ABLATION OF DYSRHYTHMIC FOCUS  05/17/2016   atrial flutter  . APPENDECTOMY  1988  . arotic valve replacement  12/2015  . ELECTROPHYSIOLOGIC STUDY N/A 05/17/2016   Procedure: A-Flutter Ablation;  Surgeon: Will Meredith Leeds, MD;  Location: [place] [facility];  Service: Cardiovascular;  Laterality: N/A;  . INTRAMEDULLARY (IM) NAIL  INTERTROCHANTERIC Right 01/29/2017   Procedure: INTRAMEDULLARY (IM) NAIL INTERTROCHANTRIC,ARTHROCENITIS RIGHT KNEE;  Surgeon: Nicholes Stairs, MD;  Location: [facility];  Service: Orthopedics;  Laterality: Right;  . [place]    Prior to Admission medications   Medication Sig Start Date End Date Taking? Authorizing Provider  aspirin 81 MG chewable tablet Chew 81 mg by mouth daily. 01/03/16  Yes [provider]  ELIQUIS 5 MG TABS tablet Take 5 mg by mouth 2 (two) times daily.  04/19/16  Yes [provider]  fluticasone (FLONASE) 50 MCG/ACT nasal spray Place 2 sprays into both nostrils daily.   Yes [provider]  lisinopril (PRINIVIL,ZESTRIL) 20 MG tablet Take 20 mg by mouth daily.    Yes [provider]  metoprolol (LOPRESSOR) 100 MG tablet Take 100 mg by mouth 2 (two) times daily.  01/03/16  Yes [provider]  pravastatin (PRAVACHOL) 20 MG tablet Take 20 mg by mouth daily.    Yes [provider]    Scheduled Meds: . fluticasone  2 spray Each Nare Daily  . folic acid  1 mg Oral Daily  . LORazepam  0-4 mg Intravenous Q12H  . metoprolol tartrate  50 mg Oral BID  . multivitamin with minerals  1 tablet Oral Daily  . pravastatin  20 mg Oral QHS  . thiamine  100 mg Oral Daily   Or  . thiamine  100 mg Intravenous Daily   Continuous Infusions: . methocarbamol (ROBAXIN)  IV 500 mg (01/29/17 1922)   PRN Meds:.acetaminophen **OR** acetaminophen, albuterol, fentaNYL (SUBLIMAZE) injection, HYDROcodone-acetaminophen, methocarbamol **OR** methocarbamol (ROBAXIN)  IV, metoCLOPramide **OR** metoCLOPramide (REGLAN) injection, metoprolol tartrate, ondansetron (ZOFRAN) IV, ondansetron **OR** ondansetron (ZOFRAN) IV  Allergies as of 01/27/2017  . (No Known Allergies)    Family History  Problem Relation Age of Onset  . Depression Mother   . Diabetes Mellitus II Father     Social History   Social History  . Marital status: Single     Spouse name: N/A  . Number of children: N/A  . Years of education: N/A   Occupational History  . Not on file.   Social History Main Topics  . Smoking status: Current Every Day Smoker    Packs/day: 1.00    Years: 30.00    Types: Cigarettes  . Smokeless tobacco: Never Used     Comment: uses vapor   . Alcohol use Yes     Comment: 3-4 beers per day  . Drug use: No  . Sexual activity: Not on file   Other Topics Concern  . Not on file   Social History Narrative  . No narrative on file    Review of Systems:  Review of Systems  Constitutional: Positive for fever and malaise/fatigue. Negative for chills.  HENT: Negative for ear discharge, ear pain, hearing loss and tinnitus.   Eyes: Negative for blurred vision and double vision.  Respiratory: Negative for cough, hemoptysis and sputum production.   Cardiovascular: Negative for chest pain and palpitations.  Gastrointestinal: Negative for abdominal pain, heartburn, nausea and vomiting.  Genitourinary: Negative for dysuria and urgency.  Musculoskeletal: Positive for falls and joint pain.  Skin: Negative for rash.  Neurological: Negative for seizures and loss of consciousness.  Endo/Heme/Allergies: Bruises/bleeds easily.  Psychiatric/Behavioral: Negative for hallucinations and suicidal ideas.    Physical Exam: Vital signs: Vitals:   01/31/17 0600 01/31/17 0750  BP: 108/85   Pulse:    Resp: 15   Temp:  98.2 F (36.8 C)   Last BM Date: 01/26/17 General:   Alert,  Well-developed, well-nourished, pleasant and cooperative in NAD HEENT : NS, atraumatic, extraocular movement intact. Oropharynx clear, neck supple. Trachea midline. Lungs:  Clear throughout to auscultation.   No wheezes, crackles, or rhonchi. No acute distress. Heart:  Tachycardia; no murmurs   or gallops. Abdomen: sot, NT, ND, BS +. No peritoneal signs. LE: trace edema RLE.  Psych. Patient appears to be frustrated. He is alert oriented 3. Rectal:   Deferred  GI:  Lab Results:  Recent Labs  01/30/17 1124 01/30/17 1836 01/31/17 0257  WBC 6.6 7.0 6.2  HGB 8.3* 8.5* 8.0*  HCT 22.9* 23.2* 22.0*  PLT 74* 89* 95*   BMET  Recent Labs  01/29/17 0407 01/30/17 0307  NA 132* 133*  K 3.7 3.5  CL 102 102  CO2 24 26  GLUCOSE 102* 132*  BUN <5* <5*  CREATININE 0.49* 0.51*  CALCIUM 8.0* 7.6*   LFT  Recent Labs  01/28/17 0853 01/29/17 0407  PROT 5.3* 5.0*  ALBUMIN 3.0* 2.8*  AST 52* 43*  ALT 26 25  ALKPHOS 37* 39  BILITOT 0.6 1.1  BILIDIR 0.1  --   IBILI 0.5  --    PT/INR No results for input(s): LABPROT, INR in the last 72 hours.   Studies/Results: Dg Knee 1-2 Views Right  Result Date: 01/30/2017 CLINICAL DATA:  51 year old male with joint effusion. Post intramedullary rod placement 01/29/2017. Subsequent encounter. EXAM: RIGHT KNEE -  1-2 VIEW COMPARISON:  01/27/2017. FINDINGS: The tip of the right femoral intramedullary rod is noted. No distal fracture detected. Suprapatellar joint effusion/swelling. No right knee fracture noted on this two view exam. IMPRESSION: Suprapatellar joint effusion/swelling. No right knee fracture noted on this two view exam. Electronically Signed   By: Genia Del M.D.   On: 01/30/2017 17:47   Dg C-arm 1-60 Min-no Report  Result Date: 01/29/2017 Fluoroscopy was utilized by the requesting physician.  No radiographic interpretation.   Dg Hip Operative Unilat With Pelvis Right  Result Date: 01/29/2017 CLINICAL DATA:  51 year old male with right intratrochanteric fracture. Subsequent encounter. EXAM: OPERATIVE right HIP (WITH PELVIS IF PERFORMED) 6 VIEWS TECHNIQUE: Fluoroscopic spot image(s) were submitted for interpretation post-operatively. COMPARISON:  01/27/2017. FINDINGS: Right femoral intramedullary rod with proximal sliding type screw and mid to distal fixation screw transfix right intertrochanteric fracture without complication identified. This can be assessed on follow-up.  IMPRESSION: Open reduction and internal fixation right intertrochanteric fracture. Electronically Signed   By: Genia Del M.D.   On: 01/29/2017 19:13    Impression/Plan: - Intermittent bright red blood per rectum since last 2 years. - Atrial flutter. Eliquis  on hold - Status post repair of right hip fracture. - History of alcohol use with Thrombocytopenia. Only mild elevated AST. Other LFTs are normal. INR is normal. - Anemia. Iron studies shows anemia of chronic disease.  Recommendations -------------------------- - Patient has no bowel movements today.  He declined inpatient colonoscopy. Risks benefits of inpatient colonoscopy in setting of recurrent GI bleed and need of future anticoagulation discussed with the patient. Patient verbalized understanding but he continues to decline colonoscopy. Consider GI bleeding scan and interventional Radiology consult for angiogram if continues to have bleeding. GI will sign off. Call us back if needed follow-up with primary GI after discharge    LOS: 4 days    Otis Brace  MD, FACP 01/31/2017, 8:31 AM  Pager [phone] If no answer or after 5 PM call [phone]

## 2017-01-31 NOTE — Progress Notes (Signed)
PROGRESS NOTE  Malik Lambert  KGU:[id number] DOB: 07-Feb-1966 DOA: 01/27/2017 PCP: Secundino Ginger, PA-C   Brief Narrative: Malik Lambert is a 51 y.o. 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 He displayed evidence of alcohol withdrawal at admission, started on librium and CIWA. Flutter waves initially noted, so amiodarone was started in addition to ~3L IVF's given for hypotension and the patient was admitted to SDU. Surgery was delayed due to anemia and thrombocytopenia requiring transfusions and to allow eliquis washout (last taken 6/22). IM nail was placed 6/25 with postoperative anemia, hgb now stable after 3rd unit transfusion 6/26. GI bleeding noted for which GI was consulted, planning colonoscopy while inpatient to inform anticoagulation decisions.  Assessment & Plan: Principal Problem:   Displaced intertrochanteric fracture of right femur, initial encounter for closed fracture [facility]) Active Problems:   Atrial flutter ([place])   Aortic stenosis   Hypertension, essential   Mixed hyperlipidemia   PAD (peripheral artery disease) (HCC)   GERD (gastroesophageal reflux disease)   Alcohol abuse   Closed displaced intertrochanteric fracture of right femur (H[facility])   H/O aortic valve replacement   Sinus tachycardia  Mechanical fall with right intertrochanteric hip fracture: s/p IM nail 6/25 by Dr. Stann Mainland.  - Still using both po and IV pain medications, will attempt to wean from IV narcotics in next 24 hours. - Weight bearing per orthopedics - PT recommending HH-PT vs. SNF depending on progress.  - SCDs for DVT ppx  Alcohol abuse with evidence of withdrawal at admission: Longterm drinking 6-8 beers nightly. Last drink was 6/22 PM. Abd U/S  demonstrated steatosis vs. cirrhosis.  - Cessation counseling provided - Librium taper completed 6/26 - CIWA, deescalate to med-surg protocol.  - Continue thiamine, MVM.  Paroxysmal atrial flutter s/p ablation: Currently in borderline sinus tachycardia . TSH wnl.  - Appreciate cardiology assistance. On metoprolol and prn beta blocker ordered.  - Holding eliquis and ASA given symptomatic anemia, GI bleeding. Will need discussion of risks/benefits of these with thrombocytopenia, alcohol use, and h/o ablation.   Lower GI bleeding and normocytic anemia: Iron studies indicate deficiency, probably related to chronic GI blood loss, and suspect significant hemodilutional component in addition to acute blood loss. 2u PRBCs 6/24 with increase hgb 6.9 > 8.6 > 6.8 post-op > 8.3 w/1u PRBCs 6/26. - Transfuse for Hgb < 8 or rebleeding - Will give iron supplement during recovery period - Monitor I/O's carefully to avoid overload  Thrombocytopenia: Plt <50k and surgery planned. No evidence of impaired hepatic synthetic function with normal INR, and no schistocytes noted on smear. Plt's improved to 70 with 1u transfusion 6/24. Stable. - Monitor.   HTN: Currently borderline hypotensive. AM cortisol ok at 14.  - Monitor on half dose metoprolol and holding lisinopril.   PAD: No critical ischemia noted - Continue statin, holding ASA/anticoagulation  Hyponatremia: Mild, improving, suspect potomania, serum osm slightly low at 269. - Continue IVF's and monitor.  DVT prophylaxis: SCDs Code Status: Full Family Communication: None at bedside. Disposition Plan: Transfer to orthopedics  Consultants:   Orthopedics, Dr. Stann Mainland  Cardiology, Dr. Marlou Porch  GI, Dr. Alessandra Bevels  Procedures:  01/29/2017 by Dr. Stann Mainland: INTRAMEDULLARY (IM) NAIL INTERTROCHANTRIC: Treatment of intertrochanteric, pertrochanteric, subtrochanteric fracture with intramedullary implant.  Antimicrobials:  Ancef perioperatively  6/25  Subjective: Pain still controlled, reports feeling a fast heart rate but no chest pain or dyspnea even on exertion w/PT. Had BRBPR yesterday, an intermittent issue for him, but none since. Denies dizziness, syncope.  Objective: Vitals:   01/31/17 1233 01/31/17 1400 01/31/17 1500 01/31/17 1547  BP: 91/71 (!) 100/45 (!) 78/58 103/68  Pulse:    95  Resp:      Temp:    98.3 F (36.8 C)  TempSrc:    Oral  SpO2: 99% 93% 92% 100%  Weight:      Height:        Intake/Output Summary (Last 24 hours) at 01/31/17 1613 Last data filed at 01/31/17 1546  Gross per 24 hour  Intake             1200 ml  Output             2000 ml  Net             -800 ml   Filed Weights   01/27/17 1707 01/28/17 0502  Weight: 68.9 kg (152 lb) 70.5 kg (155 lb 6.8 oz)    Examination: General exam: 51 y.o. male in no distress Respiratory system: Non-labored breathing room air. Clear to auscultation bilaterally.  Cardiovascular system: Regular tachycardia. II/VI early systolic blowing murmur at RUSB, rub, or gallop. No JVD, and no pedal edema. Gastrointestinal system: Abdomen soft, non-tender, non-distended, with normoactive bowel sounds. No organomegaly or masses felt. Central nervous system: Alert and oriented. No focal neurological deficits. Extremities: RLE with normal alignment, compartment soft. Dressing on lateral right hip c/d/i. SILT and motor function 5/5 in feet. Cap refill brisk throughout.  Skin: No rashes, lesions no ulcers. No ecchymoses or bleeding. Psychiatry: Judgement and insight appear normal. Mood & affect appropriate.   CBC:  Recent Labs Lab 01/27/17 1437  01/28/17 0345 01/28/17 1904 01/29/17 0407 01/30/17 0307 01/30/17 1124 01/30/17 1836 01/31/17 0257  WBC 11.6*  --  6.8 8.5 6.1 5.0 6.6 7.0 6.2  NEUTROABS 8.5*  --  4.4 5.9  --   --   --   --   --   HGB 9.0*  < > 6.9* 9.4* 8.6* 6.7* 8.3* 8.5* 8.0*  HCT 23.9*  < > 18.7* 26.1* 23.3* 18.5* 22.9* 23.2* 22.0*  MCV 98.0  --   98.9 93.2 94.0 95.4 92.7 92.1 91.3  PLT 75*  --  47* 74* 70* 68* 74* 89* 95*  < > = values in this interval not displayed. Basic Metabolic Panel:  Recent Labs Lab 01/27/17 1437 01/28/17 0345 01/29/17 0407 01/30/17 0307  NA 128* 130* 132* 133*  K 4.9 4.1 3.7 3.5  CL 97* 102 102 102  CO2 16* 21* 24 26  GLUCOSE 99 106* 102* 132*  BUN 9 8 <5* <5*  CREATININE 0.87 0.70 0.49* 0.51*  CALCIUM 8.3* 7.6* 8.0* 7.6*   GFR: Estimated Creatinine Clearance: 97.5 mL/min (A) (by C-G formula based on SCr of 0.51 mg/dL (L)).  Coagulation Profile:  Recent Labs Lab 01/27/17 1437  INR 1.12   Thyroid Function Tests: No results for input(s): TSH, T4TOTAL, FREET4, T3FREE, THYROIDAB in the last 72 hours. Anemia Panel: No results for input(s): VITAMINB12, FOLATE, FERRITIN, TIBC, IRON, RETICCTPCT in the last 72 hours. Radiology Studies: Dg Knee 1-2 Views Right  Result Date: 01/30/2017 CLINICAL DATA:  51 year old male with joint effusion. Post intramedullary rod placement 01/29/2017. Subsequent encounter. EXAM: RIGHT KNEE - 1-2 VIEW COMPARISON:  01/27/2017.  FINDINGS: The tip of the right femoral intramedullary rod is noted. No distal fracture detected. Suprapatellar joint effusion/swelling. No right knee fracture noted on this two view exam. IMPRESSION: Suprapatellar joint effusion/swelling. No right knee fracture noted on this two view exam. Electronically Signed   By: Genia Del M.D.   On: 01/30/2017 17:47   Dg C-arm 1-60 Min-no Report  Result Date: 01/29/2017 Fluoroscopy was utilized by the requesting physician.  No radiographic interpretation.   Dg Hip Operative Unilat With Pelvis Right  Result Date: 01/29/2017 CLINICAL DATA:  51 year old male with right intratrochanteric fracture. Subsequent encounter. EXAM: OPERATIVE right HIP (WITH PELVIS IF PERFORMED) 6 VIEWS TECHNIQUE: Fluoroscopic spot image(s) were submitted for interpretation post-operatively. COMPARISON:  01/27/2017. FINDINGS: Right  femoral intramedullary rod with proximal sliding type screw and mid to distal fixation screw transfix right intertrochanteric fracture without complication identified. This can be assessed on follow-up. IMPRESSION: Open reduction and internal fixation right intertrochanteric fracture. Electronically Signed   By: Genia Del M.D.   On: 01/29/2017 19:13    LOS: 4 days   Time spent: 25 minutes.  Vance Gather, MD [facility] Pager [phone]  If 7PM-7AM, please contact night-coverage [email or web address] Password [facility] 01/31/2017, 4:13 PM

## 2017-01-31 NOTE — Progress Notes (Signed)
-   Patient now thinking for having inpatient colonoscopy. He  wants to discuss with the daughter and girlfriend before deciding - He prefers to have a procedure done on Friday. Risks benefits alternatives discussed with the patient again. Recommend full liquid diet today, clear liquid diet tomorrow. GI will follow    Otis Brace MD, [place] 01/31/2017, 9:52 AM  Pager [phone]  If no answer or after 5 PM call [phone]

## 2017-01-31 NOTE — Progress Notes (Signed)
Subjective: 2 Days Post-Op Procedure(s) (LRB): INTRAMEDULLARY (IM) NAIL INTERTROCHANTRIC,ARTHROCENITIS RIGHT KNEE (Right) Patient reports pain as well controlled. Up to a 8-9 at worst out of 10, and 4 or 5 at best.  Right hip and  knee pain. tolerating Po's. Progressing with PT. Denies CP, SOB, or calf pain. Daughter and GF at bedside. He is awaiting possible colonoscopy.  Objective: Vital signs in last 24 hours: Temp:  [98.2 F (36.8 C)-101.2 F (38.4 C)] 98.3 F (36.8 C) (06/27 1547) Pulse Rate:  [95-125] 95 (06/27 1547) Resp:  [11-20] 13 (06/27 1100) BP: (78-149)/(45-102) 103/68 (06/27 1547) SpO2:  [92 %-100 %] 100 % (06/27 1547)  Intake/Output from previous day: 06/26 0701 - 06/27 0700 In: 1290 [P.O.:960; Blood:330] Out: 2375 [Urine:2375] Intake/Output this shift: Total I/O In: 240 [P.O.:240] Out: -    Recent Labs  01/29/17 0407 01/30/17 0307 01/30/17 1124 01/30/17 1836 01/31/17 0257  HGB 8.6* 6.7* 8.3* 8.5* 8.0*    Recent Labs  01/30/17 1836 01/31/17 0257  WBC 7.0 6.2  RBC 2.52* 2.41*  HCT 23.2* 22.0*  PLT 89* 95*    Recent Labs  01/29/17 0407 01/30/17 0307  NA 132* 133*  K 3.7 3.5  CL 102 102  CO2 24 26  BUN <5* <5*  CREATININE 0.49* 0.51*  GLUCOSE 102* 132*  CALCIUM 8.0* 7.6*   No results for input(s): LABPT, INR in the last 72 hours.  Well nourished. Alert and oriented x3. Right Calf soft and non tender. Right leg dressings C/D/I. No DVT signs. Compartment soft. No signs of infection.  Right LE grossly neurovascular intact. right knee effusion tender but no pain with pROM. Plantar and dorsi flexion intact, +SILT dp/sp/TN. 2+ pedal pulse.   Assessment/Plan: 2 Days Post-Op Procedure(s) (LRB): INTRAMEDULLARY (IM) NAIL INTERTROCHANTRIC,ARTHROCENITIS RIGHT KNEE (Right) PT/OT Advance diet Discharge planning, possible SNF Pain management RLE 50% WB with assistance  Right knee effusion: Xray was negative, ice to knee  Post-op anemia: Monitor  labs Asymptomatic   Thrombocytopenia: Improving Continue to monitor Holding Eliquis 5mg  bid per medicine, ok to restart from Ortho standpoint when ready SCDS   Nicholes Stairs 01/31/2017, 4:22 PM

## 2017-02-01 LAB — BASIC METABOLIC PANEL
Anion gap: 7 (ref 5–15)
BUN: <5 mg/dL — ABNORMAL LOW (ref 6–20)
CALCIUM: 8.3 mg/dL — AB (ref 8.9–10.3)
CHLORIDE: 103 mmol/L (ref 101–111)
CO2: 25 mmol/L (ref 22–32)
CREATININE: 0.59 mg/dL — AB (ref 0.61–1.24)
GFR calc Af Amer: >60 mL/min (ref >60)
GFR calc non Af Amer: >60 mL/min (ref >60)
GLUCOSE: 107 mg/dL — AB (ref 65–99)
Potassium: 3.8 mmol/L (ref 3.5–5.1)
Sodium: 135 mmol/L (ref 135–145)

## 2017-02-01 LAB — CBC
HEMATOCRIT: 23 % — AB (ref 39.0–52.0)
HEMOGLOBIN: 8 g/dL — AB (ref 13.0–17.0)
MCH: 31.7 pg (ref 26.0–34.0)
MCHC: 34.8 g/dL (ref 30.0–36.0)
MCV: 91.3 fL (ref 78.0–100.0)
Platelets: 123 10*3/uL — ABNORMAL LOW (ref 150–400)
RBC: 2.52 MIL/uL — ABNORMAL LOW (ref 4.22–5.81)
RDW: 16.4 % — AB (ref 11.5–15.5)
WBC: 5.8 10*3/uL (ref 4.0–10.5)

## 2017-02-01 MED ORDER — METOPROLOL TARTRATE 50 MG PO TABS
100.0000 mg | ORAL_TABLET | Freq: Two times a day (BID) | ORAL | Status: DC
  Administered 2017-02-01 – 2017-02-02 (×2): 100 mg via ORAL
  Filled 2017-02-01 (×2): qty 2

## 2017-02-01 MED ORDER — PEG 3350-KCL-NA BICARB-NACL 420 G PO SOLR
4000.0000 mL | Freq: Once | ORAL | Status: AC
  Administered 2017-02-01: 4000 mL via ORAL

## 2017-02-01 NOTE — Progress Notes (Signed)
Subjective: 3 Days Post-Op Procedure(s) (LRB): INTRAMEDULLARY (IM) NAIL INTERTROCHANTRIC,ARTHROCENITIS RIGHT KNEE (Right) Patient reports pain as well controlled.  Right hip and leg pain. tolerating Po's. Progressing with PT. Denies CP, SOB, or calf pain. Daughter at bedside.   Objective: Vital signs in last 24 hours: Temp:  [98.3 F (36.8 C)-98.5 F (36.9 C)] 98.5 F (36.9 C) (06/28 1344) Pulse Rate:  [86-99] 88 (06/28 1344) Resp:  [16] 16 (06/28 0519) BP: (120-134)/(86-97) 134/97 (06/28 1344) SpO2:  [78 %-100 %] 78 % (06/28 1344)  Intake/Output from previous day: 06/27 0701 - 06/28 0700 In: 240 [P.O.:240] Out: -  Intake/Output this shift: No intake/output data recorded.   Recent Labs  01/30/17 0307 01/30/17 1124 01/30/17 1836 01/31/17 0257 02/01/17 0527  HGB 6.7* 8.3* 8.5* 8.0* 8.0*    Recent Labs  01/31/17 0257 02/01/17 0527  WBC 6.2 5.8  RBC 2.41* 2.52*  HCT 22.0* 23.0*  PLT 95* 123*    Recent Labs  01/30/17 0307 02/01/17 0527  NA 133* 135  K 3.5 3.8  CL 102 103  CO2 26 25  BUN <5* <5*  CREATININE 0.51* 0.59*  GLUCOSE 132* 107*  CALCIUM 7.6* 8.3*   No results for input(s): LABPT, INR in the last 72 hours.  Well nourished. Alert and oriented x3. Right Calf soft and non tender. Right leg dressings C/D/I. No DVT signs. Compartment soft. No signs of infection.  Right LE grossly neurovascular intact. right knee effusion. Plantar and dorsi flexion intact. 2+ pedal pulse.   Assessment/Plan: 3 Days Post-Op Procedure(s) (LRB): INTRAMEDULLARY (IM) NAIL INTERTROCHANTRIC,ARTHROCENITIS RIGHT KNEE (Right) PT/OT Advance diet Discharge planning, possible SNF Pain management RLE 50% WB  Right knee effusion: Xray today Modified WB Monitor  Post-op anemia: Monitor labs Asymptomatic   Thrombocytopenia: Improving Continue to monitor Holding Eliquis 5mg  bid per medicine, ok to restart from Ortho standpoint when ready SCDS   Nicholes Stairs 02/01/2017, 9:10 PM

## 2017-02-01 NOTE — Progress Notes (Signed)
Physical Therapy Treatment Patient Details Name: Malik Lambert MRN: [id number] DOB: 1966-07-13 Today's Date: 02/01/2017    History of Present Illness  51 y.o.-year-old male who sustained a right hip fracture. ORIF 01/29/17 with PWB (50%). 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  The pt has Aortic Valve replacement in May of 2017.     PT Comments    Pt motivated and progressing steadily with mobility - ltd by HR elevating into 140s   Follow Up Recommendations  Home health PT;SNF;Supervision/Assistance - 24 hour     Equipment Recommendations  Rolling walker with 5" wheels    Recommendations for Other Services OT consult     Precautions / Restrictions Precautions Precautions: Fall Restrictions Weight Bearing Restrictions: Yes RUE Weight Bearing: Partial weight bearing RLE Weight Bearing: Partial weight bearing    Mobility  Bed Mobility Overal bed mobility: Needs Assistance Bed Mobility: Supine to Sit     Supine to sit: Min assist;HOB elevated     General bed mobility comments: cues for sequence and use of L LE to self assist  Transfers Overall transfer level: Needs assistance Equipment used: Rolling walker (2 wheeled) Transfers: Sit to/from Stand Sit to Stand: Min assist         General transfer comment: cues for LE management and use of UEs to self assist  Ambulation/Gait Ambulation/Gait assistance: Min assist Ambulation Distance (Feet): 70 Feet Assistive device: Rolling walker (2 wheeled) Gait Pattern/deviations: Step-to pattern;Decreased step length - right;Decreased step length - left;Decreased stance time - right;Shuffle Gait velocity: decr Gait velocity interpretation: Below normal speed for age/gender General Gait Details: cues for sequence, posture and position from RW.  Pt demonstrating good follow through with PWB.  Distance ltd by HR elevated into 140s   Stairs            Wheelchair Mobility    Modified Rankin (Stroke  Patients Only)       Balance                                            Cognition Arousal/Alertness: Awake/alert Behavior During Therapy: Anxious Overall Cognitive Status: Within Functional Limits for tasks assessed                                        Exercises General Exercises - Lower Extremity Ankle Circles/Pumps: AROM;Both;15 reps;Supine Quad Sets: AROM;Both;10 reps;Supine Heel Slides: AAROM;Right;15 reps;Supine Hip ABduction/ADduction: AAROM;Right;15 reps;Supine    General Comments        Pertinent Vitals/Pain Pain Assessment: 0-10 Pain Score: 5  Pain Location: Rhip Pain Descriptors / Indicators: Aching;Discomfort Pain Intervention(s): Limited activity within patient's tolerance;Monitored during session;Premedicated before session;Ice applied    Home Living                      Prior Function            PT Goals (current goals can now be found in the care plan section) Acute Rehab PT Goals Patient Stated Goal: to get back to work PT Goal Formulation: With patient Time For Goal Achievement: 02/13/17 Potential to Achieve Goals: Good Progress towards PT goals: Progressing toward goals    Frequency    7X/week      PT Plan Current plan  remains appropriate    Co-evaluation              AM-PAC PT "6 Clicks" Daily Activity  Outcome Measure  Difficulty turning over in bed (including adjusting bedclothes, sheets and blankets)?: Total Difficulty moving from lying on back to sitting on the side of the bed? : Total Difficulty sitting down on and standing up from a chair with arms (e.g., wheelchair, bedside commode, etc,.)?: A Lot Help needed moving to and from a bed to chair (including a wheelchair)?: A Little Help needed walking in hospital room?: A Little Help needed climbing 3-5 steps with a railing? : A Lot 6 Click Score: 12    End of Session Equipment Utilized During Treatment: Gait belt Activity  Tolerance: Patient tolerated treatment well;Other (comment) Patient left: in chair;with call bell/phone within reach Nurse Communication: Mobility status PT Visit Diagnosis: Difficulty in walking, not elsewhere classified (R26.2)     Time: 8088-1103 PT Time Calculation (min) (ACUTE ONLY): 32 min  Charges:  $Gait Training: 8-22 mins $Therapeutic Exercise: 8-22 mins                    G Codes:       Pg [phone]    Aibhlinn Kalmar 02/01/2017, 12:32 PM

## 2017-02-01 NOTE — Progress Notes (Signed)
Occupational Therapy Treatment Patient Details Name: Malik Lambert MRN: [id number] DOB: 1966-07-13 Today's Date: 02/01/2017    History of present illness  51 y.o.-year-old male who sustained a right hip fracture. ORIF 01/29/17 with PWB (50%). 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  The pt has Aortic Valve replacement in May of 2017.    OT comments  Educated on AE and pt used leg lifter.  Possible d/c tomorrow  Follow Up Recommendations  Supervision/Assistance - 24 hour   Equipment Recommendations  3 in 1 bedside commode (pt states girlfriend will get this)    Recommendations for Other Services      Precautions / Restrictions Precautions Precautions: Fall Restrictions RLE Weight Bearing: Partial weight bearing       Mobility Bed Mobility         Supine to sit: Min guard Sit to supine: Min guard   General bed mobility comments: using leg lifter. Cues for UE placement  Transfers   Equipment used: Rolling walker (2 wheeled)   Sit to Stand: Min guard;From elevated surface         General transfer comment: cues for UE and LE placement    Balance                                           ADL either performed or assessed with clinical judgement   ADL                           Toilet Transfer: Min guard;Ambulation;RW (chair)             General ADL Comments: educated on reacher for LB adls and leg lifter for bed mobility. He will have daughter assist with socks.  Pt wants to get these items himself on [employer] and he wants girlfriend to get 3:1 rather than order through hospital.  Educated that he can use 3:1 as a shower seat also.  Pt verbalizes use of AE and he liked leg lifter as it decreased pain. Ambulated around bed to chair at end of session     Vision       Perception     Praxis      Cognition Arousal/Alertness: Awake/alert Behavior During Therapy: Anxious Overall Cognitive Status: Within Functional  Limits for tasks assessed                                 General Comments: had to repeat a couple of questions--pt prepping for colonoscopy this afternoon        Exercises     Shoulder Instructions       General Comments      Pertinent Vitals/ Pain       Pain Score: 5  Pain Location: Rhip Pain Descriptors / Indicators: Aching;Discomfort Pain Intervention(s): Limited activity within patient's tolerance;Monitored during session;Repositioned  Home Living                                          Prior Functioning/Environment              Frequency           Progress Toward Goals  OT Goals(current goals can now be found in the care plan section)  Progress towards OT goals: Progressing toward goals  Acute Rehab OT Goals Patient Stated Goal: to get back to work Time For Goal Achievement: 02/13/17  Plan Discharge plan needs to be updated    Co-evaluation                 AM-PAC PT "6 Clicks" Daily Activity     Outcome Measure   Help from another person eating meals?: None Help from another person taking care of personal grooming?: A Little Help from another person toileting, which includes using toliet, bedpan, or urinal?: A Little Help from another person bathing (including washing, rinsing, drying)?: A Lot Help from another person to put on and taking off regular upper body clothing?: A Little Help from another person to put on and taking off regular lower body clothing?: A Lot 6 Click Score: 17    End of Session    OT Visit Diagnosis: Unsteadiness on feet (R26.81);Pain Pain - Right/Left: Right Pain - part of body: Hip   Activity Tolerance Patient tolerated treatment well   Patient Left in chair;with call bell/phone within reach;with family/visitor present   Nurse Communication          Time: 5015-8682 OT Time Calculation (min): 14 min  Charges: OT General Charges $OT Visit: 1 Procedure OT  Treatments $Self Care/Home Management : 8-22 mins  Malik Lambert, OTR/L [phone] 02/01/2017   Malik Lambert 02/01/2017, 4:23 PM

## 2017-02-01 NOTE — Care Management Note (Signed)
Case Management Note  Patient Details  Name: Malik Lambert MRN: [id number] Date of Birth: Mar 14, 1966  Subjective/Objective: PT recc HHPT,rw,3n1. [facility] rep Kim aware of dme orders to deliver rw,3n1 to rm prior d/c.Provided patient w/HHC agency list await choice.                   Action/Plan:d/c home w/HHC.   Expected Discharge Date:  01/31/17               Expected Discharge Plan:  [place]  In-House Referral:     Discharge planning Services  CM Consult  Post Acute Care Choice:    Choice offered to:  Patient  DME Arranged:  3-N-1, Walker rolling DME Agency:  [place]:    [facility] Agency:     Status of Service:  In process, will continue to follow  If discussed at Long Length of Stay Meetings, dates discussed:    Additional Comments:  Dessa Phi, RN 02/01/2017, 5:27 PM

## 2017-02-01 NOTE — Progress Notes (Signed)
Physical Therapy Treatment Patient Details Name: Malik Lambert MRN: [id number] DOB: Apr 28, 1966 Today's Date: 02/01/2017    History of Present Illness  51 y.o.-year-old male who sustained a right hip fracture. ORIF 01/29/17 with PWB (50%). 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  The pt has Aortic Valve replacement in May of 2017.     PT Comments    Pt continues to progress steadily with mobility and hopeful for dc home tomorrow.   Follow Up Recommendations  Home health PT     Equipment Recommendations  Rolling walker with 5" wheels    Recommendations for Other Services OT consult     Precautions / Restrictions Precautions Precautions: Fall Restrictions RLE Weight Bearing: Partial weight bearing RLE Partial Weight Bearing Percentage or Pounds: 50    Mobility  Bed Mobility         Supine to sit: Min guard Sit to supine: Min guard   General bed mobility comments: Pt OOB on arrival and left sitting EOB with OT at session end  Transfers Overall transfer level: Needs assistance Equipment used: Rolling walker (2 wheeled) Transfers: Sit to/from Stand Sit to Stand: Min guard         General transfer comment: cues for UE and LE placement  Ambulation/Gait Ambulation/Gait assistance: Min guard Ambulation Distance (Feet): 100 Feet Assistive device: Rolling walker (2 wheeled) Gait Pattern/deviations: Step-to pattern;Decreased step length - right;Decreased step length - left;Decreased stance time - right;Shuffle Gait velocity: decr Gait velocity interpretation: Below normal speed for age/gender General Gait Details: cues for sequence, posture and position from RW.  Pt demonstrating good follow through with PWB.     Stairs            Wheelchair Mobility    Modified Rankin (Stroke Patients Only)       Balance                                            Cognition Arousal/Alertness: Awake/alert Behavior During Therapy:  Anxious Overall Cognitive Status: Within Functional Limits for tasks assessed                                 General Comments: had to repeat a couple of questions--pt prepping for colonoscopy this afternoon      Exercises      General Comments        Pertinent Vitals/Pain Pain Assessment: 0-10 Pain Score: 5  Pain Location: Rhip Pain Descriptors / Indicators: Aching;Discomfort Pain Intervention(s): Limited activity within patient's tolerance;Monitored during session;Premedicated before session;Ice applied    Home Living                      Prior Function            PT Goals (current goals can now be found in the care plan section) Acute Rehab PT Goals Patient Stated Goal: to get back to work PT Goal Formulation: With patient Time For Goal Achievement: 02/13/17 Potential to Achieve Goals: Good Progress towards PT goals: Progressing toward goals    Frequency    7X/week      PT Plan Discharge plan needs to be updated    Co-evaluation              AM-PAC PT "6 Clicks" Daily  Activity  Outcome Measure  Difficulty turning over in bed (including adjusting bedclothes, sheets and blankets)?: A Lot Difficulty moving from lying on back to sitting on the side of the bed? : A Lot Difficulty sitting down on and standing up from a chair with arms (e.g., wheelchair, bedside commode, etc,.)?: A Little Help needed moving to and from a bed to chair (including a wheelchair)?: A Little Help needed walking in hospital room?: A Little Help needed climbing 3-5 steps with a railing? : A Lot 6 Click Score: 15    End of Session Equipment Utilized During Treatment: Gait belt Activity Tolerance: Patient tolerated treatment well Patient left: Other (comment) (EOB with OT) Nurse Communication: Mobility status PT Visit Diagnosis: Difficulty in walking, not elsewhere classified (R26.2)     Time: 3295-1884 PT Time Calculation (min) (ACUTE ONLY): 19  min  Charges:  $Gait Training: 8-22 mins                    G Codes:       Pg [phone]    Vishwa Dais 02/01/2017, 5:46 PM

## 2017-02-01 NOTE — Progress Notes (Signed)
Subjective: 3 Days Post-Op Procedure(s) (LRB): INTRAMEDULLARY (IM) NAIL INTERTROCHANTRIC,ARTHROCENITIS RIGHT KNEE (Right) Patient reports pain as well controlled. tolerating Po's. Progressing with PT. Denies CP, SOB, or calf pain.   Objective: Vital signs in last 24 hours: Temp:  [98.3 F (36.8 C)-98.5 F (36.9 C)] 98.5 F (36.9 C) (06/28 1344) Pulse Rate:  [86-99] 88 (06/28 1344) Resp:  [16] 16 (06/28 0519) BP: (120-134)/(86-97) 134/97 (06/28 1344) SpO2:  [78 %-100 %] 78 % (06/28 1344)  Intake/Output from previous day: 06/27 0701 - 06/28 0700 In: 240 [P.O.:240] Out: -  Intake/Output this shift: No intake/output data recorded.   Recent Labs  01/30/17 0307 01/30/17 1124 01/30/17 1836 01/31/17 0257 02/01/17 0527  HGB 6.7* 8.3* 8.5* 8.0* 8.0*    Recent Labs  01/31/17 0257 02/01/17 0527  WBC 6.2 5.8  RBC 2.41* 2.52*  HCT 22.0* 23.0*  PLT 95* 123*    Recent Labs  01/30/17 0307 02/01/17 0527  NA 133* 135  K 3.5 3.8  CL 102 103  CO2 26 25  BUN <5* <5*  CREATININE 0.51* 0.59*  GLUCOSE 132* 107*  CALCIUM 7.6* 8.3*   No results for input(s): LABPT, INR in the last 72 hours.  Well nourished. Alert and oriented x3. Right Calf soft and non tender. Right leg dressings C/D/I. No DVT signs. Compartment soft. No signs of infection.  Right LE grossly neurovascular intact. right knee effusion tender but no pain with pROM. Plantar and dorsi flexion intact, +SILT dp/sp/TN. 2+ pedal pulse.   Assessment/Plan: 3 Days Post-Op Procedure(s) (LRB): INTRAMEDULLARY (IM) NAIL INTERTROCHANTRIC,ARTHROCENITIS RIGHT KNEE (Right) PT/OT Advance diet Discharge planning, fu with Aleni Andrus  Pain management RLE 50% WB with assistance  Right knee effusion: Xray was negative, ice to knee  Post-op anemia: Monitor labs, improved s/p transfusion Asymptomatic   Thrombocytopenia: stable Continue to monitor Holding Eliquis 5mg  bid per medicine, ok to restart from Ortho standpoint when  ready SCDS   Nicholes Stairs 02/01/2017, 9:03 PM

## 2017-02-01 NOTE — Progress Notes (Signed)
[facility] Gastroenterology Progress Note  Malik Lambert 51 y.o. 01/02/1966  CC:  Rectal bleeding   Subjective: Patient was transferred out of ICU. No further bleeding episodes. Denied abdominal pain, nausea, vomiting.  ROS : Negative for chest pain and shortness of breath.   Objective: Vital signs in last 24 hours: Vitals:   01/31/17 1951 02/01/17 0519  BP: 104/73 (!) 134/92  Pulse: (!) 105 89  Resp: 16 16  Temp: 98.2 F (36.8 C) 98.3 F (36.8 C)    Physical Exam:  General:  Alert, cooperative, no distress, appears stated age  Head:  Normocephalic, without obvious abnormality, atraumatic  Eyes:  , EOM's intact,   Lungs:   Clear to auscultation bilaterally, respirations unlabored  Heart:  Regular rate and rhythm, S1, S2 normal  Abdomen:   Soft, non-tender, bowel sounds active all four quadrants,  no masses, No peritoneal signs           Lab Results:  Recent Labs  01/30/17 0307 02/01/17 0527  NA 133* 135  K 3.5 3.8  CL 102 103  CO2 26 25  GLUCOSE 132* 107*  BUN <5* <5*  CREATININE 0.51* 0.59*  CALCIUM 7.6* 8.3*   No results for input(s): AST, ALT, ALKPHOS, BILITOT, PROT, ALBUMIN in the last 72 hours.  Recent Labs  01/31/17 0257 02/01/17 0527  WBC 6.2 5.8  HGB 8.0* 8.0*  HCT 22.0* 23.0*  MCV 91.3 91.3  PLT 95* 123*   No results for input(s): LABPROT, INR in the last 72 hours.    Assessment/Plan: - Intermittent bright red blood per rectum since last 2 years. - Atrial flutter. Eliquis  on hold since admission - Status post repair of right hip fracture. - History of alcohol use with Thrombocytopenia. Only mild elevated AST. Other LFTs are normal. INR is normal. - Anemia. Iron studies shows anemia of chronic disease.  Recommendations -------------------------- - Patient is agreeable to proceed with inpatient colonoscopy. Risk benefits alternatives discussed with the patient. Verbalized understanding. Hemoglobin stable. Decision to restart anticoagulation  depending on colonoscopy findings. - Patient was advised to follow up with primary GI after discharge to follow-up to have outpatient workup for abnormal LFTs. - Clear liquid diet today. Nothing by mouth past midnight   Otis Brace MD, FACP 02/01/2017, 8:32 AM  Pager [phone]  If no answer or after 5 PM call [phone]

## 2017-02-01 NOTE — Progress Notes (Signed)
PROGRESS NOTE  Render Malik Lambert  SAY:[id number] DOB: 26-Jan-1966 DOA: 01/27/2017 PCP: Malik Ginger, PA-C   Brief Narrative: Malik Lambert is a 51 y.o. 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 He displayed evidence of alcohol withdrawal at admission, started on librium and CIWA. Flutter waves initially noted, so amiodarone was started in addition to ~3L IVF's given for hypotension and the patient was admitted to SDU. Surgery was delayed due to anemia and thrombocytopenia requiring transfusions and to allow eliquis washout (last taken 6/22). IM nail was placed 6/25 with postoperative anemia, hgb now stable after 3rd unit transfusion 6/26. GI bleeding noted for which GI was consulted, planning colonoscopy while inpatient to inform anticoagulation decisions. He has continued to work with PT.   Assessment & Plan: Principal Problem:   Displaced intertrochanteric fracture of right femur, initial encounter for closed fracture [facility]) Active Problems:   Atrial flutter ([place])   Aortic stenosis   Hypertension, essential   Mixed hyperlipidemia   PAD (peripheral artery disease) ([facility])   GERD (gastroesophageal reflux disease)   Alcohol abuse   Closed displaced intertrochanteric fracture of right femur ([facility])   H/O aortic valve replacement   Sinus tachycardia  Mechanical fall with right intertrochanteric hip fracture: s/p IM nail 6/25 by Dr. Stann Mainland.  - Still using both po and IV pain medications, will attempt to wean from IV narcotics in next 24 hours. - Partial weight bearing per orthopedics - PT recommending HH-PT vs. SNF depending on progress.  - SCDs for DVT ppx  Lower GI bleeding and normocytic anemia: Iron studies indicate mild deficiency,  probably related to chronic GI blood loss, and suspect significant hemodilutional component in addition to acute blood loss. 2u PRBCs 6/24 with increase hgb 6.9 > 8.6 > 6.8 post-op > 8.3 w/1u PRBCs 6/26. - Transfuse for Hgb < 8 or rebleeding - Will give po iron supplement - Monitor I/O's carefully to avoid overload  Alcohol abuse with evidence of withdrawal at admission: Longterm drinking 6-8 beers nightly. Last drink was 6/22 PM. Abd U/S demonstrated steatosis vs. cirrhosis.  - Cessation counseling provided - Librium taper completed 6/26, no longer withdrawing, DC CIWA - Continue thiamine, MVM.  Paroxysmal atrial flutter s/p ablation: Currently in borderline sinus tachycardia. TSH wnl.  - Appreciate cardiology assistance initially. Will increase metoprolol back to home dose as BPs have improved, prn beta blocker also ordered.  - Holding eliquis and ASA given symptomatic anemia, GI bleeding. Will need discussion of risks/benefits of these with thrombocytopenia, alcohol use, and h/o ablation.   Thrombocytopenia: Plt <50k and surgery planned. No evidence of impaired hepatic synthetic function with normal INR, and no schistocytes noted on smear. Plt's improved to 70 with 1u transfusion 6/24. Stable. - Monitor.   HTN: Currently borderline hypotensive. AM cortisol ok at 14.  - Monitor on full dose metoprolol and holding lisinopril.   PAD: No critical ischemia noted - Continue statin, holding ASA/anticoagulation  Hyponatremia: Mild, resolved, suspect potomania, serum osm slightly low at 269. - Monitor  DVT prophylaxis: SCDs Code Status: Full Family Communication: None at bedside. Disposition Plan: Transfer to orthopedics  Consultants:   Orthopedics, Dr. Stann Mainland  Cardiology, Dr. Marlou Porch  GI, Dr. Alessandra Bevels  Procedures:  01/29/2017 by Dr. Stann Mainland: INTRAMEDULLARY (IM)  NAIL INTERTROCHANTRIC: Treatment of intertrochanteric, pertrochanteric, subtrochanteric fracture with intramedullary  implant.  Antimicrobials:  Ancef perioperatively 6/25  Subjective: Wants to "get out of here" as soon as is safely possible. Explained he needs to be off IV narcotics. He has progressed with PT without orthostatic symptoms, BPs coming up, denies palpitations, dyspnea, chest pain. Pain is controlled. No more evidence of withdrawals. No further bleeding.  Objective: Vitals:   01/31/17 1951 02/01/17 0519 02/01/17 0843 02/01/17 1047  BP: 104/73 (!) 134/92 132/86 120/87  Pulse: (!) 105 89 86 99  Resp: 16 16    Temp: 98.2 F (36.8 C) 98.3 F (36.8 C)    TempSrc: Oral Oral    SpO2: 100% 100%    Weight:      Height:       General exam: 51 y.o. male in no distress playing cards with family Respiratory system: Non-labored breathing room air. Clear to auscultation bilaterally.  Cardiovascular system: Regular tachycardia. II/VI early systolic blowing murmur at RUSB, rub, or gallop. No JVD, and no pedal edema. Gastrointestinal system: Abdomen soft, non-tender, non-distended, with normoactive bowel sounds. No organomegaly or masses felt. Central nervous system: Alert and oriented. No focal neurological deficits. Extremities: RLE with normal alignment, compartment soft. Dressing on lateral right hip c/d/i. Neurovascularly intact. Cap refill brisk throughout.  Skin: No rashes, lesions no ulcers. No ecchymoses or bleeding. Psychiatry: Judgement and insight appear normal. Mood & affect appropriate.   CBC:  Recent Labs Lab 01/27/17 1437  01/28/17 0345 01/28/17 1904  01/30/17 0307 01/30/17 1124 01/30/17 1836 01/31/17 0257 02/01/17 0527  WBC 11.6*  --  6.8 8.5  < > 5.0 6.6 7.0 6.2 5.8  NEUTROABS 8.5*  --  4.4 5.9  --   --   --   --   --   --   HGB 9.0*  < > 6.9* 9.4*  < > 6.7* 8.3* 8.5* 8.0* 8.0*  HCT 23.9*  < > 18.7* 26.1*  < > 18.5* 22.9* 23.2* 22.0* 23.0*  MCV 98.0  --  98.9 93.2  < > 95.4 92.7 92.1 91.3 91.3  PLT 75*  --  47* 74*  < > 68* 74* 89* 95* 123*  < > = values in this  interval not displayed. Basic Metabolic Panel:  Recent Labs Lab 01/27/17 1437 01/28/17 0345 01/29/17 0407 01/30/17 0307 02/01/17 0527  NA 128* 130* 132* 133* 135  K 4.9 4.1 3.7 3.5 3.8  CL 97* 102 102 102 103  CO2 16* 21* 24 26 25   GLUCOSE 99 106* 102* 132* 107*  BUN 9 8 <5* <5* <5*  CREATININE 0.87 0.70 0.49* 0.51* 0.59*  CALCIUM 8.3* 7.6* 8.0* 7.6* 8.3*     LOS: 5 days   Time spent: 25 minutes.  Vance Gather, MD [facility] Pager [phone]  If 7PM-7AM, please contact night-coverage [email or web address] Password [email or web address] 02/01/2017, 1:05 PM

## 2017-02-02 ENCOUNTER — Inpatient Hospital Stay (HOSPITAL_COMMUNITY): Payer: BLUE CROSS/BLUE SHIELD | Admitting: Anesthesiology

## 2017-02-02 ENCOUNTER — Encounter (HOSPITAL_COMMUNITY): Payer: Self-pay

## 2017-02-02 ENCOUNTER — Encounter (HOSPITAL_COMMUNITY)
Admission: EM | Disposition: A | Discharge status: Home Health | Payer: Self-pay | Source: Home / Self Care | Attending: Family Medicine

## 2017-02-02 HISTORY — PX: COLONOSCOPY WITH PROPOFOL: SHX5780

## 2017-02-02 LAB — CBC
HCT: 22.9 % — ABNORMAL LOW (ref 39.0–52.0)
Hemoglobin: 8 g/dL — ABNORMAL LOW (ref 13.0–17.0)
MCH: 32.3 pg (ref 26.0–34.0)
MCHC: 34.9 g/dL (ref 30.0–36.0)
MCV: 92.3 fL (ref 78.0–100.0)
PLATELETS: 152 10*3/uL (ref 150–400)
RBC: 2.48 MIL/uL — AB (ref 4.22–5.81)
RDW: 15.8 % — AB (ref 11.5–15.5)
WBC: 5.2 10*3/uL (ref 4.0–10.5)

## 2017-02-02 SURGERY — COLONOSCOPY WITH PROPOFOL
Anesthesia: Monitor Anesthesia Care

## 2017-02-02 MED ORDER — LACTATED RINGERS IV SOLN
INTRAVENOUS | Status: DC | PRN
  Administered 2017-02-02: 12:00:00 via INTRAVENOUS

## 2017-02-02 MED ORDER — PHENYLEPHRINE 40 MCG/ML (10ML) SYRINGE FOR IV PUSH (FOR BLOOD PRESSURE SUPPORT)
PREFILLED_SYRINGE | INTRAVENOUS | Status: AC
  Filled 2017-02-02: qty 10

## 2017-02-02 MED ORDER — PROPOFOL 10 MG/ML IV BOLUS
INTRAVENOUS | Status: AC
  Filled 2017-02-02: qty 60

## 2017-02-02 MED ORDER — LIDOCAINE 2% (20 MG/ML) 5 ML SYRINGE
INTRAMUSCULAR | Status: DC | PRN
  Administered 2017-02-02: 80 mg via INTRAVENOUS

## 2017-02-02 MED ORDER — PROPOFOL 500 MG/50ML IV EMUL
INTRAVENOUS | Status: DC | PRN
  Administered 2017-02-02: 150 ug/kg/min via INTRAVENOUS

## 2017-02-02 MED ORDER — FERROUS SULFATE 325 (65 FE) MG PO TABS: 325.0000 mg | ORAL_TABLET | Freq: Two times a day (BID) | ORAL | 0 refills | Status: AC

## 2017-02-02 MED ORDER — LIDOCAINE 2% (20 MG/ML) 5 ML SYRINGE
INTRAMUSCULAR | Status: AC
  Filled 2017-02-02: qty 5

## 2017-02-02 MED ORDER — PROPOFOL 10 MG/ML IV BOLUS
INTRAVENOUS | Status: DC | PRN
  Administered 2017-02-02: 30 mg via INTRAVENOUS
  Administered 2017-02-02: 20 mg via INTRAVENOUS
  Administered 2017-02-02: 30 mg via INTRAVENOUS

## 2017-02-02 SURGICAL SUPPLY — 21 items

## 2017-02-02 NOTE — Progress Notes (Signed)
Occupational Therapy Treatment Patient Details Name: Malik Lambert MRN: [id number] DOB: 1966-06-02 Today's Date: 02/02/2017    History of present illness  51 y.o.-year-old male who sustained a right hip fracture. ORIF 01/29/17 with PWB (50%). 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  The pt has Aortic Valve replacement in May of 2017.    OT comments  Pt. Able to return demo of tub transfer in preparation for d/c home.  Pt. Very eager to d/c when able.    Follow Up Recommendations  Supervision/Assistance - 24 hour;Other (comment)    Equipment Recommendations  3 in 1 bedside commode    Recommendations for Other Services      Precautions / Restrictions Precautions Precautions: Fall Restrictions RUE Weight Bearing: Partial weight bearing RLE Weight Bearing: Partial weight bearing RLE Partial Weight Bearing Percentage or Pounds: 50       Mobility Bed Mobility               General bed mobility comments: pt. seated in recliner at beginning and end of OT session  Transfers Overall transfer level: Needs assistance Equipment used: Rolling walker (2 wheeled) Transfers: Sit to/from [facility] Sit to Stand: Min guard Stand pivot transfers: Min guard            Balance                                           ADL either performed or assessed with clinical judgement   ADL Overall ADL's : Needs assistance/impaired                                 Tub/ Shower Transfer: Tub transfer;Min guard;Rolling walker;3 in 1   Functional mobility during ADLs: Min guard;Rolling walker General ADL Comments: family present for session and assisted with trouble shooting which b.room pt. will use and which tub.  pt. will use tub with R faucet.  due to [facility] status and use of 3n1 we were able to have pt. return demo of using 3n1 with post. legs in the tub and ant. legs of 3n1 outside of the tub and just sit to maintian wbs and  they will attach a hand held shower wand for thorough bathing.  this will prevent attempts on side stepping over the tub which he would not be able to due with current pwb status     Vision       Perception     Praxis      Cognition Arousal/Alertness: Awake/alert Behavior During Therapy: Anxious;Agitated Overall Cognitive Status: Within Functional Limits for tasks assessed                                 General Comments: fluctuation in mood as he wanted to ask questions and make sure he understood things        Exercises     Shoulder Instructions       General Comments      Pertinent Vitals/ Pain       Pain Assessment: No/denies pain  Home Living  Prior Functioning/Environment              Frequency  Min 3X/week        Progress Toward Goals  OT Goals(current goals can now be found in the care plan section)  Progress towards OT goals: Progressing toward goals     Plan Discharge plan remains appropriate    Co-evaluation                 AM-PAC PT "6 Clicks" Daily Activity     Outcome Measure   Help from another person eating meals?: None Help from another person taking care of personal grooming?: A Little Help from another person toileting, which includes using toliet, bedpan, or urinal?: A Little Help from another person bathing (including washing, rinsing, drying)?: A Lot Help from another person to put on and taking off regular upper body clothing?: A Little Help from another person to put on and taking off regular lower body clothing?: A Lot 6 Click Score: 17    End of Session Equipment Utilized During Treatment: Gait belt;Rolling walker  OT Visit Diagnosis: Unsteadiness on feet (R26.81);Pain Pain - Right/Left: Right Pain - part of body: Hip   Activity Tolerance Patient tolerated treatment well   Patient Left in chair;with call bell/phone within reach;with  family/visitor present;with nursing/sitter in room   Nurse Communication          Time: 1019-1040 OT Time Calculation (min): 21 min  Charges: OT General Charges $OT Visit: 1 Procedure OT Treatments $Self Care/Home Management : 8-22 mins   Janice Coffin, COTA/L 02/02/2017, 10:55 AM

## 2017-02-02 NOTE — Anesthesia Postprocedure Evaluation (Signed)
Anesthesia Post Note  Patient: Malik Lambert  Procedure(s) Performed: Procedure(s) (LRB): COLONOSCOPY WITH PROPOFOL (N/A)     Patient location during evaluation: PACU Anesthesia Type: MAC Level of consciousness: awake Pain management: pain level controlled Vital Signs Assessment: post-procedure vital signs reviewed and stable Respiratory status: spontaneous breathing Cardiovascular status: stable Postop Assessment: no signs of nausea or vomiting Anesthetic complications: no    Last Vitals:  Vitals:   02/02/17 1400 02/02/17 1410  BP: 101/74 132/88  Pulse: 78 92  Resp: 17 20  Temp: 36.6 C     Last Pain:  Vitals:   02/02/17 1400  TempSrc: Oral  PainSc:    Pain Goal: Patients Stated Pain Goal: 5 (02/01/17 2050)               Brandolyn Shortridge JR,JOHN Mateo Flow

## 2017-02-02 NOTE — Op Note (Signed)
[facility] Patient Name: Malik Lambert Procedure Date: 02/02/2017 MRN: [id number] Attending MD: Missy Sabins , MD Date of Birth: June 28, 1966 CSN: [id number] Age: 51 Admit Type: Inpatient Procedure:                Colonoscopy Indications:              Hematochezia Providers:                Elyse Jarvis. Amedeo Plenty, MD, Carmie End, RN, Tinnie Gens, Technician Referring MD:              Medicines:                Propofol per Anesthesia Complications:            No immediate complications. Estimated Blood Loss:     Estimated blood loss: none. Procedure:                Pre-Anesthesia Assessment:                           - Prior to the procedure, a History and Physical                            was performed, and patient medications and                            allergies were reviewed. The patient's tolerance of                            previous anesthesia was also reviewed. The risks                            and benefits of the procedure and the sedation                            options and risks were discussed with the patient.                            All questions were answered, and informed consent                            was obtained. Prior Anticoagulants: The patient has                            taken Eliquis (apixaban), last dose was 7 days                            prior to procedure. ASA Grade Assessment: II - A                            patient with mild systemic disease. After reviewing  the risks and benefits, the patient was deemed in                            satisfactory condition to undergo the procedure.                           After obtaining informed consent, the colonoscope                            was passed under direct vision. Throughout the                            procedure, the patient's blood pressure, pulse, and                            oxygen saturations were monitored  continuously. The                            EC-[id number] ([id number]) scope was introduced through                            the anus and advanced to the the cecum, identified                            by appendiceal orifice and ileocecal valve. The                            colonoscopy was performed without difficulty. The                            patient tolerated the procedure well. The quality                            of the bowel preparation was good. The ileocecal                            valve, appendiceal orifice, and rectum were                            photographed. Scope In: 1:30:55 PM Scope Out: 1:49:16 PM Scope Withdrawal Time: 0 hours 11 minutes 58 seconds  Total Procedure Duration: 0 hours 18 minutes 21 seconds  Findings:      Multiple diverticula were found in the sigmoid colon, descending colon       and transverse colon.      A 8 mm polyp was found in the sigmoid colon. The polyp was sessile. The       polyp was removed with a hot snare. Resection and retrieval were       complete.      External hemorrhoids were found during endoscopy. The hemorrhoids were       Grade II (internal hemorrhoids that prolapse but reduce spontaneously).      The exam was otherwise without abnormality on direct and retroflexion       views. Impression:               -  Diverticulosis in the sigmoid colon, in the                            descending colon and in the transverse colon.                           - One 8 mm polyp in the sigmoid colon, removed with                            a hot snare. Resected and retrieved.                           - External hemorrhoids.                           - The examination was otherwise normal on direct                            and retroflexion views. Moderate Sedation:      no moderate sedation Recommendation:           - Patient has a contact number available for                            emergencies. The signs and symptoms of  potential                            delayed complications were discussed with the                            patient. Return to normal activities tomorrow.                            Written discharge instructions were provided to the                            patient.                           - Resume previous diet.                           - Continue present medications.                           - Await pathology results.                           - Repeat colonoscopy in 5-10 years for surveillance                            based on pathology results. Procedure Code(s):        --- Professional ---                           [phone], Colonoscopy, flexible; with removal of  tumor(s), polyp(s), or other lesion(s) by snare                            technique Diagnosis Code(s):        --- Professional ---                           K64.1, Second degree hemorrhoids                           K64.4, Residual hemorrhoidal skin tags                           D12.5, Benign neoplasm of sigmoid colon                           K92.1, Melena (includes Hematochezia)                           K57.30, Diverticulosis of large intestine without                            perforation or abscess without bleeding CPT copyright 2016 [employer]. All rights reserved. The codes documented in this report are preliminary and upon coder review may  be revised to meet current compliance requirements. Missy Sabins, MD 02/02/2017 1:56:48 PM This report has been signed electronically. Number of Addenda: 0

## 2017-02-02 NOTE — Care Management Note (Signed)
Case Management Note  Patient Details  Name: Malik Lambert MRN: [id number] Date of Birth: 1966-05-22  Subjective/Objective:     [facility] for [facility] [employer] aware of orders, & d/c today.dme has already been delivered to rm. No further CM needs.               Action/Plan:d/c home w/HHC/dme   Expected Discharge Date:  02/02/17               Expected Discharge Plan:  [place]  In-House Referral:     Discharge planning Services  CM Consult  Post Acute Care Choice:    Choice offered to:  Patient  DME Arranged:  3-N-1, Walker rolling DME Agency:  [place]:  [place]:  [place]  Status of Service:  Completed, signed off  If discussed at [place] of Stay Meetings, dates discussed:    Additional Comments:  Dessa Phi, RN 02/02/2017, 3:40 PM

## 2017-02-02 NOTE — H&P (View-Only) (Signed)
Physical Therapy Treatment Patient Details Name: Malik Lambert MRN: [id number] DOB: Jan 04, 1966 Today's Date: 02/02/2017    History of Present Illness  51 y.o.-year-old male who sustained a right hip fracture. ORIF 01/29/17 with PWB (50%). 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  The pt has Aortic Valve replacement in May of 2017.     PT Comments    Pt continues to progress steadily with mobility.  Reviewed therex and stairs with written information provided.   Follow Up Recommendations  Home health PT     Equipment Recommendations  Rolling walker with 5" wheels    Recommendations for Other Services OT consult     Precautions / Restrictions Precautions Precautions: Fall Restrictions RUE Weight Bearing: Partial weight bearing RLE Weight Bearing: Partial weight bearing RLE Partial Weight Bearing Percentage or Pounds: 50    Mobility  Bed Mobility               General bed mobility comments: Pt up in recliner and requests back to same  Transfers Overall transfer level: Needs assistance Equipment used: Rolling walker (2 wheeled) Transfers: Sit to/from Stand Sit to Stand: Min guard Stand pivot transfers: Min guard       General transfer comment: cues for UE and LE placement  Ambulation/Gait Ambulation/Gait assistance: Min guard;Supervision Ambulation Distance (Feet): 80 Feet Assistive device: Rolling walker (2 wheeled) Gait Pattern/deviations: Step-to pattern;Decreased step length - right;Decreased step length - left;Decreased stance time - right;Shuffle Gait velocity: decr Gait velocity interpretation: Below normal speed for age/gender General Gait Details: min cues for sequence, posture and position from RW.  Pt demonstrating good follow through with PWB.     Stairs Stairs: Yes   Stair Management: No rails;One rail Left;Step to pattern;Backwards;Forwards;With walker;With crutches Number of Stairs: 7 General stair comments: single step  twice bkwd with RW and 5 stairs with crutch and rail.  Cues for sequence and foot/crutch placement  Wheelchair Mobility    Modified Rankin (Stroke Patients Only)       Balance Overall balance assessment: Needs assistance Sitting-balance support: No upper extremity supported;Feet supported Sitting balance-Leahy Scale: Good       Standing balance-Leahy Scale: Fair                              Cognition Arousal/Alertness: Awake/alert Behavior During Therapy: Anxious Overall Cognitive Status: Within Functional Limits for tasks assessed                                 General Comments: fluctuation in mood as he wanted to ask questions and make sure he understood things      Exercises General Exercises - Lower Extremity Ankle Circles/Pumps: AROM;Both;15 reps;Supine Quad Sets: AROM;Both;10 reps;Supine Heel Slides: AAROM;Right;Supine;20 reps Hip ABduction/ADduction: AAROM;Right;15 reps;Supine    General Comments        Pertinent Vitals/Pain Pain Assessment: 0-10 Pain Score: 5  Pain Location: Rhip Pain Descriptors / Indicators: Aching;Discomfort Pain Intervention(s): Limited activity within patient's tolerance;Monitored during session;Premedicated before session;Ice applied    Home Living                      Prior Function            PT Goals (current goals can now be found in the care plan section) Acute Rehab PT Goals Patient Stated Goal: to get  back to work PT Goal Formulation: With patient Time For Goal Achievement: 02/13/17 Potential to Achieve Goals: Good Progress towards PT goals: Progressing toward goals    Frequency    7X/week      PT Plan Discharge plan needs to be updated    Co-evaluation              AM-PAC PT "6 Clicks" Daily Activity  Outcome Measure  Difficulty turning over in bed (including adjusting bedclothes, sheets and blankets)?: A Little Difficulty moving from lying on back to sitting on  the side of the bed? : A Little Difficulty sitting down on and standing up from a chair with arms (e.g., wheelchair, bedside commode, etc,.)?: A Little Help needed moving to and from a bed to chair (including a wheelchair)?: A Little Help needed walking in hospital room?: A Little Help needed climbing 3-5 steps with a railing? : A Little 6 Click Score: 18    End of Session Equipment Utilized During Treatment: Gait belt Activity Tolerance: Patient tolerated treatment well Patient left: in chair;with call bell/phone within reach Nurse Communication: Mobility status PT Visit Diagnosis: Difficulty in walking, not elsewhere classified (R26.2)     Time: 5072-2575 PT Time Calculation (min) (ACUTE ONLY): 49 min  Charges:  $Gait Training: 23-37 mins $Therapeutic Exercise: 8-22 mins                    G Codes:       Pg [phone]    Malik Lambert 02/02/2017, 12:01 PM

## 2017-02-02 NOTE — Progress Notes (Signed)
Patient given discharge instructions, and verbalized an understanding of all discharge instructions.  Patient agrees with discharge plan, and is being discharged in stable medical condition.  Patient given transportation via wheelchair. 

## 2017-02-02 NOTE — Discharge Summary (Signed)
Physician Discharge Summary  Malik Lambert WVP:[id number] DOB: 03-07-1966 DOA: 01/27/2017  PCP: Secundino Ginger, PA-C  Admit date: 01/27/2017 Discharge date: 02/02/2017  Admitted From: Home Disposition: Home   Recommendations for Outpatient Follow-up:  1. Follow up with PCP in 1-2 weeks 2. Please obtain CBC in one week to monitor anemia (stable, hgb 8) and thrombocytopenia (resolved at discharge). Started on iron supplement.  3. Continued alcohol cessation counseling.  4. Please follow up on the following pending results: Pathology on 61mm polyp in sigmoid colon. Otherwise, follow up screening colonoscopy recommended in 5-10 years based on results.  5. Follow up with cardiology for ongoing discussion of anticoagulation. Eliquis is felt to be a high risk medication in alcohol abuser with thrombocytopenia which has since resolved.   Home Health: PT Equipment/Devices: Rolling walker Discharge Condition: Stable CODE STATUS: Full Diet recommendation: Heart healthy  Brief/Interim Summary: Malik Lambert is a 51 y.o. male with a history of alcohol abuse, AFlutter s/p ablation, rheumatic heart disease s/p bovine AVR May 2017, PAD, on eliquis and aspirin, chronic HFpEF, and HTN who presented to the ED after a fall at home with sudden, sharp, severe, constant right hip pain found to have intertrochanteric hip fracture. He displayed evidence of alcohol withdrawal at admission, started on librium and CIWA. Flutter waves initially noted, so amiodarone was started in addition to ~3L IVF's given for hypotension and the patient was admitted to SDU. Cardiology was consulted, recommended stopping amiodarone and monitoring telemetry which has shown continued sinus tachycardia only, NSR at discharge.   Surgery was delayed due to anemia and thrombocytopenia requiring transfusions and to allow eliquis washout (last taken 6/22). IM nail was placed 6/25 with postoperative anemia, hgb has remained stable at 8 after 3rd  unit transfusion 6/26. GI bleeding noted 6/27 for which GI was consulted and performed colonoscopy 6/29 showing non-bleeding diverticulosis, biopsied sigmoid polyp, and external hemorrhoids. Cardiology continues to recommend eliquis for atrial flutter, so this and aspirin will be continued at discharge. The patient is aware of the risks of bleeding and has been cautioned to return for care if he notices bleeding.   Discharge Diagnoses:  Principal Problem:   Displaced intertrochanteric fracture of right femur, initial encounter for closed fracture [facility]) Active Problems:   Atrial flutter ([place])   Aortic stenosis   Hypertension, essential   Mixed hyperlipidemia   PAD (peripheral artery disease) ([facility])   GERD (gastroesophageal reflux disease)   Alcohol abuse   Closed displaced intertrochanteric fracture of right femur ([facility])   H/O aortic valve replacement   Sinus tachycardia  Mechanical fall with right intertrochanteric hip fracture: s/p IM nail 6/25 by Dr. Stann Mainland.  - Rx for oxycodone provided by orthopedics - Ok to restart eliquis per orthopedics for additional indication: DVT ppx.  - Partial weight bearing per orthopedics, will need to schedule follow up 2 weeks postop - PT recommending HH-PT with rolling walker, arranged prior to discharge.  Lower GI bleeding and normocytic anemia: Iron studies indicate mild deficiency, probably related to chronic GI blood loss, and suspect significant hemodilutional component in addition to acute blood loss. 2u PRBCs 6/24 with increase hgb 6.9 > 8.6 > 6.8 post-op > 8.3 w/1u PRBCs 6/26, has remained 8.0 since.  - No recent bleeding and hgb stable. Non-bleeding diverticulosis only noted on colonoscopy 6/29. - Will give po iron supplement  Alcohol abuse with evidence of withdrawal at admission: Longterm drinking 6-8 beers nightly. Last drink was 6/22 PM. Abd U/S demonstrated steatosis vs. cirrhosis.  -  Cessation counseling provided - Librium taper completed  6/26, no longer withdrawing, DC CIWA  Paroxysmal atrial flutter s/p ablation: Only sinus tachycardia then NSR noted on telemetric monitoring throughout admission. TSH wnl.  - Appreciate cardiology assistance: BP will allow return to full dose of home medications including metoprolol.  - Plan to restart eliquis per cardiology recommendations, and follow up with Dr. Curt Bears, cardiology, for further discussions of anticoagulation.   Thrombocytopenia: Resolved. Initially required transfusion prior to surgery on 6/24.  - Monitor at follow up  HTN: Has become normotensive after relative hypotension. AM cortisol ok at 14.  - Monitor on full dose metoprolol and ok to restart lisinopril.  PAD: No critical ischemia noted. - Continue statin, ASA/anticoagulation  Hyponatremia: Mild, resolved, suspect potomania, serum osm slightly low at 269. - Monitor  Discharge Instructions Discharge Instructions    Discharge instructions    Complete by:  As directed    You were admitted for a fall and hip fracture which was repaired by Dr. Stann Mainland. You will need to follow up with his office in 2 weeks for recheck and advise regarding return to work. In the meantime you will need to have someone assisting you at home.  - Continue taking your medications as you were, including eliquis and aspirin.  - You may also take oxycodone as needed for pain.  - If you notice any bleeding, seek medical attention right away. Follow up with your cardiologist soon to discuss the risks and benefits of anticoagulation.     Allergies as of 02/02/2017   No Known Allergies     Medication List    TAKE these medications   aspirin 81 MG chewable tablet Chew 81 mg by mouth daily.   ELIQUIS 5 MG Tabs tablet Generic drug:  apixaban Take 5 mg by mouth 2 (two) times daily.   ferrous sulfate 325 (65 FE) MG tablet Take 1 tablet (325 mg total) by mouth 2 (two) times daily with a meal.   fluticasone 50 MCG/ACT nasal  spray Commonly known as:  FLONASE Place 2 sprays into both nostrils daily.   lisinopril 20 MG tablet Commonly known as:  PRINIVIL,ZESTRIL Take 20 mg by mouth daily.   metoprolol tartrate 100 MG tablet Commonly known as:  LOPRESSOR Take 100 mg by mouth 2 (two) times daily.   oxyCODONE 5 MG immediate release tablet Commonly known as:  ROXICODONE Take 1-2 tablets (5-10 mg total) by mouth every 4 (four) hours as needed for moderate pain.   pravastatin 20 MG tablet Commonly known as:  PRAVACHOL Take 20 mg by mouth daily.            Durable Medical Equipment        Start     Ordered   02/01/17 1443  For home use only DME Walker rolling  Once    Question:  Patient needs a walker to treat with the following condition  Answer:  Unsteady gait   02/01/17 1443   02/01/17 1442  For home use only DME 3 n 1  Once     02/01/17 1442     Follow-up Information    Nicholes Stairs, MD. Schedule an appointment as soon as possible for a visit in 2 week(s).   Specialty:  Orthopedic Surgery Contact information: [address] [place] [address] [phone]        Secundino Ginger, PA-C Follow up.   Specialty:  Cardiology Contact information: [facility]  [address]  [place] [address] [phone]          No Known Allergies  Consultations:  Orthopedics, Dr. Stann Mainland  Cardiology, Dr. Marlou Porch  GI, Dr. Alessandra Bevels  Procedures/Studies: Dg Pelvis 1-2 Views  Result Date: 01/27/2017 CLINICAL DATA:  Fall this morning down 15 stairs with right hip pain. EXAM: PELVIS - 1-2 VIEW COMPARISON:  None. FINDINGS: There is a displaced intertrochanteric fracture of the right hip. Mild exaggeration coxa vera deformity over the fracture site. Remainder of the exam is within normal. IMPRESSION: Displaced intertrochanteric right hip fracture. Electronically Signed   By: Marin Olp M.D.   On: 01/27/2017 15:19   Dg Knee 1-2 Views Right  Result Date:  01/30/2017 CLINICAL DATA:  51 year old male with joint effusion. Post intramedullary rod placement 01/29/2017. Subsequent encounter. EXAM: RIGHT KNEE - 1-2 VIEW COMPARISON:  01/27/2017. FINDINGS: The tip of the right femoral intramedullary rod is noted. No distal fracture detected. Suprapatellar joint effusion/swelling. No right knee fracture noted on this two view exam. IMPRESSION: Suprapatellar joint effusion/swelling. No right knee fracture noted on this two view exam. Electronically Signed   By: Genia Del M.D.   On: 01/30/2017 17:47   Ct Head Wo Contrast  Result Date: 01/27/2017 CLINICAL DATA:  Fall earlier today downstairs, headache, on anticoagulation EXAM: CT HEAD WITHOUT CONTRAST TECHNIQUE: Contiguous axial images were obtained from the base of the skull through the vertex without intravenous contrast. COMPARISON:  None. FINDINGS: Brain: Mild brain atrophy and chronic white matter microvascular ischemic changes about the ventricles. No acute intracranial hemorrhage, mass lesion, definite infarction midline shift, herniation, hydrocephalus, or extra-axial fluid collection. Bilateral basal ganglia calcifications noted. Cisterns remain patent. Cerebellar atrophy as well. Vascular: No hyperdense vessel or unexpected calcification. Skull: Normal. Negative for fracture or focal lesion. Sinuses/Orbits: No acute finding. Other: None. IMPRESSION: Brain atrophy and chronic white matter microvascular ischemic changes. No acute intracranial abnormality by noncontrast CT. Bilateral basal ganglia calcifications Electronically Signed   By: Jerilynn Mages.  Shick M.D.   On: 01/27/2017 15:07   Dg Chest Port 1 View  Result Date: 01/27/2017 CLINICAL DATA:  Fall this morning down 15 stairs with right hip pain. EXAM: PORTABLE CHEST 1 VIEW COMPARISON:  None. FINDINGS: Lungs are adequately inflated without consolidation or effusion. No pneumothorax. Cardiomediastinal silhouette is within normal. There is mild calcified plaque over  the aortic arch. Median sternotomy wires and prosthetic heart valve are present. Old left posterolateral fifth rib fracture. IMPRESSION: No acute findings. Aortic atherosclerosis. Electronically Signed   By: Marin Olp M.D.   On: 01/27/2017 15:21   Dg C-arm 1-60 Min-no Report  Result Date: 01/29/2017 Fluoroscopy was utilized by the requesting physician.  No radiographic interpretation.   Dg Hip Operative Unilat With Pelvis Right  Result Date: 01/29/2017 CLINICAL DATA:  51 year old male with right intratrochanteric fracture. Subsequent encounter. EXAM: OPERATIVE right HIP (WITH PELVIS IF PERFORMED) 6 VIEWS TECHNIQUE: Fluoroscopic spot image(s) were submitted for interpretation post-operatively. COMPARISON:  01/27/2017. FINDINGS: Right femoral intramedullary rod with proximal sliding type screw and mid to distal fixation screw transfix right intertrochanteric fracture without complication identified. This can be assessed on follow-up. IMPRESSION: Open reduction and internal fixation right intertrochanteric fracture. Electronically Signed   By: Genia Del M.D.   On: 01/29/2017 19:13   Dg Femur, Min 2 Views Right  Result Date: 01/27/2017 CLINICAL DATA:  Fall this morning down 15 stairs with right hip pain. EXAM: RIGHT FEMUR 2 VIEWS COMPARISON:  None. FINDINGS: Examination demonstrates a displaced intertrochanteric fracture of  the right hip. Remainder of the exam is unremarkable. IMPRESSION: Displaced intertrochanteric right hip fracture. Electronically Signed   By: Marin Olp M.D.   On: 01/27/2017 15:18   US Abdomen Limited Ruq  Result Date: 01/28/2017 CLINICAL DATA:  All volvulus EXAM: ULTRASOUND ABDOMEN LIMITED RIGHT UPPER QUADRANT COMPARISON:  None. FINDINGS: Gallbladder: No gallstones or wall thickening visualized. No sonographic Murphy sign noted by sonographer. Common bile duct: Diameter: Normal at 4 mm Liver: Increased liver echogenicity compared to the kidneys. No duct dilatation. No  focal lesion. IMPRESSION: 1. Increased liver echogenicity commonly represents hepatic steatosis. Finding could indicate hepatocellular disease (cirrhosis). 2. Normal gallbladder.  No biliary duct dilatation. Electronically Signed   By: Suzy Bouchard M.D.   On: 01/28/2017 11:48   01/29/2017 by Dr. Stann Mainland: INTRAMEDULLARY (IM) NAIL INTERTROCHANTRIC: Treatment of intertrochanteric, pertrochanteric, subtrochanteric fracture with intramedullary implant.  Colonoscopy 02/02/2017 by Dr. Amedeo Plenty: - Diverticulosis in the sigmoid colon, in the descending colon and in the transverse colon. - One 8 mm polyp in the sigmoid colon, removed with a hot snare. Resected and retrieved. - External hemorrhoids. - The examination was otherwise normal on direct and retroflexion views.  Subjective: Feel well, wants to go home. No bleeding, chest pain, palpitations, dyspnea.    Discharge Exam: Vitals:   02/02/17 1410 02/02/17 1419  BP: 132/88 140/86  Pulse: 92 90  Resp: 20 20  Temp:     General: Pleasant male in no distress Cardiovascular: RRR, II/VI early systolic blowing murmur at RUSB > carotid. No JVD or edema Respiratory: Nonlabored, clear Abdominal: Soft, NT, ND, bowel sounds + Ext: RLE with normal alignment, compartment soft. Dressing on lateral right hip c/d/i. Neurovascularly intact. Cap refill brisk throughout.   Labs: Basic Metabolic Panel:  Recent Labs Lab 01/27/17 1437 01/28/17 0345 01/29/17 0407 01/30/17 0307 02/01/17 0527  NA 128* 130* 132* 133* 135  K 4.9 4.1 3.7 3.5 3.8  CL 97* 102 102 102 103  CO2 16* 21* 24 26 25   GLUCOSE 99 106* 102* 132* 107*  BUN 9 8 <5* <5* <5*  CREATININE 0.87 0.70 0.49* 0.51* 0.59*  CALCIUM 8.3* 7.6* 8.0* 7.6* 8.3*   Liver Function Tests:  Recent Labs Lab 01/28/17 0853 01/29/17 0407  AST 52* 43*  ALT 26 25  ALKPHOS 37* 39  BILITOT 0.6 1.1  PROT 5.3* 5.0*  ALBUMIN 3.0* 2.8*   CBC:  Recent Labs Lab 01/27/17 1437  01/28/17 0345 01/28/17 1904   01/30/17 1124 01/30/17 1836 01/31/17 0257 02/01/17 0527 02/02/17 0547  WBC 11.6*  --  6.8 8.5  < > 6.6 7.0 6.2 5.8 5.2  NEUTROABS 8.5*  --  4.4 5.9  --   --   --   --   --   --   HGB 9.0*  < > 6.9* 9.4*  < > 8.3* 8.5* 8.0* 8.0* 8.0*  HCT 23.9*  < > 18.7* 26.1*  < > 22.9* 23.2* 22.0* 23.0* 22.9*  MCV 98.0  --  98.9 93.2  < > 92.7 92.1 91.3 91.3 92.3  PLT 75*  --  47* 74*  < > 74* 89* 95* 123* 152  < > = values in this interval not displayed.  Time coordinating discharge: Approximately 40 minutes  Vance Gather, MD  [facility] 02/02/2017, 3:30 PM Pager [phone]

## 2017-02-02 NOTE — Transfer of Care (Signed)
Immediate Anesthesia Transfer of Care Note  Patient: Malik Lambert  Procedure(s) Performed: Procedure(s): COLONOSCOPY WITH PROPOFOL (N/A)  Patient Location: PACU and Endoscopy Unit  Anesthesia Type:MAC  Level of Consciousness: awake and patient cooperative  Airway & Oxygen Therapy: Patient Spontanous Breathing and Patient connected to face mask oxygen  Post-op Assessment: Report given to RN and Post -op Vital signs reviewed and stable  Post vital signs: Reviewed and stable  Last Vitals:  Vitals:   02/02/17 0550 02/02/17 1224  BP: 123/88 (!) 145/97  Pulse: 87 85  Resp: 17 13  Temp: 36.8 C 37.4 C    Last Pain:  Vitals:   02/02/17 1224  TempSrc: Oral  PainSc:       Patients Stated Pain Goal: 5 (50/35/46 5681)  Complications: No apparent anesthesia complications

## 2017-02-02 NOTE — Interval H&P Note (Signed)
History and Physical Interval Note:  02/02/2017 12:26 PM  Malik Lambert  has presented today for surgery, with the diagnosis of Rectal bleeding  The various methods of treatment have been discussed with the patient and family. After consideration of risks, benefits and other options for treatment, the patient has consented to  Procedure(s): COLONOSCOPY WITH PROPOFOL (N/A) as a surgical intervention .  The patient's history has been reviewed, patient examined, no change in status, stable for surgery.  I have reviewed the patient's chart and labs.  Questions were answered to the patient's satisfaction.     Sybella Harnish C

## 2017-02-02 NOTE — Progress Notes (Signed)
Physical Therapy Treatment Patient Details Name: Malik Lambert MRN: [id number] DOB: March 01, 1966 Today's Date: 02/02/2017    History of Present Illness  51 y.o.-year-old male who sustained a right hip fracture. ORIF 01/29/17 with PWB (50%). 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  The pt has Aortic Valve replacement in May of 2017.     PT Comments    Pt continues to progress steadily with mobility.  Reviewed therex and stairs with written information provided.   Follow Up Recommendations  Home health PT     Equipment Recommendations  Rolling walker with 5" wheels    Recommendations for Other Services OT consult     Precautions / Restrictions Precautions Precautions: Fall Restrictions RUE Weight Bearing: Partial weight bearing RLE Weight Bearing: Partial weight bearing RLE Partial Weight Bearing Percentage or Pounds: 50    Mobility  Bed Mobility               General bed mobility comments: Pt up in recliner and requests back to same  Transfers Overall transfer level: Needs assistance Equipment used: Rolling walker (2 wheeled) Transfers: Sit to/from Stand Sit to Stand: Min guard Stand pivot transfers: Min guard       General transfer comment: cues for UE and LE placement  Ambulation/Gait Ambulation/Gait assistance: Min guard;Supervision Ambulation Distance (Feet): 80 Feet Assistive device: Rolling walker (2 wheeled) Gait Pattern/deviations: Step-to pattern;Decreased step length - right;Decreased step length - left;Decreased stance time - right;Shuffle Gait velocity: decr Gait velocity interpretation: Below normal speed for age/gender General Gait Details: min cues for sequence, posture and position from RW.  Pt demonstrating good follow through with PWB.     Stairs Stairs: Yes   Stair Management: No rails;One rail Left;Step to pattern;Backwards;Forwards;With walker;With crutches Number of Stairs: 7 General stair comments: single step  twice bkwd with RW and 5 stairs with crutch and rail.  Cues for sequence and foot/crutch placement  Wheelchair Mobility    Modified Rankin (Stroke Patients Only)       Balance Overall balance assessment: Needs assistance Sitting-balance support: No upper extremity supported;Feet supported Sitting balance-Leahy Scale: Good       Standing balance-Leahy Scale: Fair                              Cognition Arousal/Alertness: Awake/alert Behavior During Therapy: Anxious Overall Cognitive Status: Within Functional Limits for tasks assessed                                 General Comments: fluctuation in mood as he wanted to ask questions and make sure he understood things      Exercises General Exercises - Lower Extremity Ankle Circles/Pumps: AROM;Both;15 reps;Supine Quad Sets: AROM;Both;10 reps;Supine Heel Slides: AAROM;Right;Supine;20 reps Hip ABduction/ADduction: AAROM;Right;15 reps;Supine    General Comments        Pertinent Vitals/Pain Pain Assessment: 0-10 Pain Score: 5  Pain Location: Rhip Pain Descriptors / Indicators: Aching;Discomfort Pain Intervention(s): Limited activity within patient's tolerance;Monitored during session;Premedicated before session;Ice applied    Home Living                      Prior Function            PT Goals (current goals can now be found in the care plan section) Acute Rehab PT Goals Patient Stated Goal: to get  back to work PT Goal Formulation: With patient Time For Goal Achievement: 02/13/17 Potential to Achieve Goals: Good Progress towards PT goals: Progressing toward goals    Frequency    7X/week      PT Plan Discharge plan needs to be updated    Co-evaluation              AM-PAC PT "6 Clicks" Daily Activity  Outcome Measure  Difficulty turning over in bed (including adjusting bedclothes, sheets and blankets)?: A Little Difficulty moving from lying on back to sitting on  the side of the bed? : A Little Difficulty sitting down on and standing up from a chair with arms (e.g., wheelchair, bedside commode, etc,.)?: A Little Help needed moving to and from a bed to chair (including a wheelchair)?: A Little Help needed walking in hospital room?: A Little Help needed climbing 3-5 steps with a railing? : A Little 6 Click Score: 18    End of Session Equipment Utilized During Treatment: Gait belt Activity Tolerance: Patient tolerated treatment well Patient left: in chair;with call bell/phone within reach Nurse Communication: Mobility status PT Visit Diagnosis: Difficulty in walking, not elsewhere classified (R26.2)     Time: 1173-5670 PT Time Calculation (min) (ACUTE ONLY): 49 min  Charges:  $Gait Training: 23-37 mins $Therapeutic Exercise: 8-22 mins                    G Codes:       Pg [phone]    Maryellen Dowdle 02/02/2017, 12:01 PM

## 2017-02-02 NOTE — Anesthesia Procedure Notes (Signed)
Procedure Name: MAC Date/Time: 02/02/2017 1:22 PM Performed by: Dione Booze Pre-anesthesia Checklist: Patient identified, Emergency Drugs available, Suction available and Patient being monitored Patient Re-evaluated:Patient Re-evaluated prior to inductionOxygen Delivery Method: Simple face mask Placement Confirmation: positive ETCO2

## 2017-02-02 NOTE — Anesthesia Preprocedure Evaluation (Signed)
Anesthesia Evaluation  Patient identified by MRN, date of birth, ID band Patient awake    Reviewed: Allergy & Precautions, H&P , NPO status , Patient's Chart, lab work & pertinent test results, reviewed documented beta blocker date and time   Airway Mallampati: II  TM Distance: >3 FB Neck ROM: full    Dental no notable dental hx. (+) Teeth Intact   Pulmonary Current Smoker,    Pulmonary exam normal breath sounds clear to auscultation       Cardiovascular hypertension, Pt. on home beta blockers  Rhythm:regular Rate:Normal + Systolic murmurs    Neuro/Psych    GI/Hepatic Neg liver ROS,   Endo/Other  negative endocrine ROS  Renal/GU negative Renal ROS  negative genitourinary   Musculoskeletal   Abdominal Normal abdominal exam  (+)   Peds  Hematology  (+) Blood dyscrasia, anemia ,   Anesthesia Other Findings Low plts at 70K EF 55% Anemia.... 8.6  Reproductive/Obstetrics                                                              Anesthesia Evaluation  Patient identified by MRN, date of birth, ID band Patient awake    Reviewed: Allergy & Precautions, NPO status , Patient's Chart, lab work & pertinent test results, reviewed documented beta blocker date and time   Airway Mallampati: II  TM Distance: >3 FB Neck ROM: Full    Dental no notable dental hx.    Pulmonary neg pulmonary ROS, Current Smoker,    Pulmonary exam normal breath sounds clear to auscultation       Cardiovascular hypertension, + Peripheral Vascular Disease  negative cardio ROS Normal cardiovascular exam Rhythm:Regular Rate:Normal     Neuro/Psych Alcohol abusenegative neurological ROS     GI/Hepatic negative GI ROS, Neg liver ROS, GERD  ,  Endo/Other  negative endocrine ROS  Renal/GU negative Renal ROS  negative genitourinary   Musculoskeletal negative musculoskeletal ROS (+)   Abdominal    Peds negative pediatric ROS (+)  Hematology negative hematology ROS (+) anemia ,   Anesthesia Other Findings   Reproductive/Obstetrics negative OB ROS                             Anesthesia Physical Anesthesia Plan  ASA: III and emergent  Anesthesia Plan: General   Post-op Pain Management:    Induction: Intravenous  PONV Risk Score and Plan: 2 and Ondansetron, Dexamethasone and Treatment may vary due to age or medical condition  Airway Management Planned: Oral ETT  Additional Equipment:   Intra-op Plan:   Post-operative Plan: Extubation in OR  Informed Consent: I have reviewed the patients History and Physical, chart, labs and discussed the procedure including the risks, benefits and alternatives for the proposed anesthesia with the patient or authorized representative who has indicated his/her understanding and acceptance.   Dental advisory given  Plan Discussed with: CRNA  Anesthesia Plan Comments:         Anesthesia Quick Evaluation  Anesthesia Physical  Anesthesia Plan  ASA: III  Anesthesia Plan: MAC   Post-op Pain Management:    Induction:   PONV Risk Score and Plan: 0  Airway Management Planned: Natural Airway, Nasal Cannula and Simple Face Mask  Additional Equipment:  Intra-op Plan:   Post-operative Plan: Extubation in OR  Informed Consent: I have reviewed the patients History and Physical, chart, labs and discussed the procedure including the risks, benefits and alternatives for the proposed anesthesia with the patient or authorized representative who has indicated his/her understanding and acceptance.   Dental Advisory Given  Plan Discussed with: CRNA and Surgeon  Anesthesia Plan Comments: (  )        Anesthesia Quick Evaluation

## 2017-02-02 NOTE — Discharge Instructions (Signed)
-  50% weightbearing to the operative extremity with your assistive device such as walker or crutches. This will be in place for 4 weeks. -For prevention of blood clots return using your Eliquis as directed. -It is okay to shower with your bandages in place. Do not remove the bandages unless they become saturated. Your surgeon will remove them at your 2 week postoperative appointment. -Return to see your surgeon in 2 weeks for a wound check.  - Start taking iron twice daily to help your body make new blood cells. This will constipate you, so also take miralax once or twice daily.

## 2017-02-05 ENCOUNTER — Encounter (HOSPITAL_COMMUNITY): Payer: Self-pay | Admitting: Gastroenterology

## 2017-02-14 ENCOUNTER — Emergency Department (HOSPITAL_COMMUNITY)
Admission: EM | Admit: 2017-02-14 | Discharge: 2017-02-14 | Disposition: A | Discharge status: Home / Self Care | Payer: BLUE CROSS/BLUE SHIELD | Attending: Emergency Medicine | Admitting: Emergency Medicine

## 2017-02-14 ENCOUNTER — Encounter (HOSPITAL_COMMUNITY): Payer: Self-pay | Admitting: Emergency Medicine

## 2017-02-14 ENCOUNTER — Emergency Department (HOSPITAL_COMMUNITY): Payer: BLUE CROSS/BLUE SHIELD

## 2017-02-14 DIAGNOSIS — M25561 Pain in right knee: Secondary | ICD-10-CM | POA: Diagnosis not present

## 2017-02-14 DIAGNOSIS — M25551 Pain in right hip: Secondary | ICD-10-CM | POA: Diagnosis not present

## 2017-02-14 DIAGNOSIS — I10 Essential (primary) hypertension: Secondary | ICD-10-CM | POA: Insufficient documentation

## 2017-02-14 DIAGNOSIS — Z7901 Long term (current) use of anticoagulants: Secondary | ICD-10-CM | POA: Diagnosis not present

## 2017-02-14 DIAGNOSIS — F17210 Nicotine dependence, cigarettes, uncomplicated: Secondary | ICD-10-CM | POA: Insufficient documentation

## 2017-02-14 DIAGNOSIS — Z7982 Long term (current) use of aspirin: Secondary | ICD-10-CM | POA: Diagnosis not present

## 2017-02-14 DIAGNOSIS — M25461 Effusion, right knee: Secondary | ICD-10-CM | POA: Diagnosis not present

## 2017-02-14 DIAGNOSIS — Z79899 Other long term (current) drug therapy: Secondary | ICD-10-CM | POA: Diagnosis not present

## 2017-02-14 MED ORDER — ACETAMINOPHEN 500 MG PO TABS
1000.0000 mg | ORAL_TABLET | Freq: Once | ORAL | Status: AC
  Administered 2017-02-14: 1000 mg via ORAL
  Filled 2017-02-14: qty 2

## 2017-02-14 MED ORDER — IBUPROFEN 200 MG PO TABS
400.0000 mg | ORAL_TABLET | Freq: Once | ORAL | Status: AC
  Administered 2017-02-14: 400 mg via ORAL
  Filled 2017-02-14: qty 2

## 2017-02-14 NOTE — ED Triage Notes (Signed)
Pt c/o right leg, hip, knee, ankle pain onset 01/27/17 after falling and fracturing hip and femur, worsened significantly last night after twisting, falling gently to sitting position on stair landing. Performed PT today, pain worsened after. No numbness or tingling. Last dose oxycontin 5 mg at 1530. 2+ pedal pulse bilaterally. Sensation and motor function intact. Point tenderness to right ankle, femur, knee, hip.

## 2017-02-14 NOTE — ED Provider Notes (Signed)
[facility] DEPT Provider Note   CSN: [id number] Arrival date & time: 02/14/17  1651     History   Chief Complaint Chief Complaint  Patient presents with  . Hip Pain    HPI Malik Lambert is a 51 y.o. male.  HPI Presents with concern for right knee pain.  Patient had recent intertrochanteric hip fracture, and has been home recovering. Reports last night he stumbled and twisted his knee. He felt ok initially, did PT today, however later in the day slowly developed severe right knee pain. Reports he has had swelling since fall, DVT study done and negative, was given compression stockings with relief of edema. Paine worse with bending knee and palpiation. Is taking oxycodone. Daughter concerned for possible new fracture and encouraged pt to present.    Past Medical History:  Diagnosis Date  . Acid reflux   . Alcohol abuse   . Anxiety disorder   . Aortic stenosis   . Aortic valve defect   . Ascending aortic aneurysm ([place])   . Atrial flutter ([place])   . Chronic constipation   . Dyspnea   . GERD (gastroesophageal reflux disease)   . Heart murmur   . Heart murmur   . High risk medication use   . Hypertension, essential   . LVH (left ventricular hypertrophy)   . Mixed hyperlipidemia   . PAD (peripheral artery disease) ([place])   . Rheumatic fever/heart disease   . Vitamin D deficiency     Patient Active Problem List   Diagnosis Date Noted  . Sinus tachycardia   . H/O aortic valve replacement   . Displaced intertrochanteric fracture of right femur, initial encounter for closed fracture ([place]) 01/27/2017  . Closed displaced intertrochanteric fracture of right femur ([place]) 01/27/2017  . Aortic stenosis   . Hypertension, essential   . Mixed hyperlipidemia   . PAD (peripheral artery disease) ([place])   . GERD (gastroesophageal reflux disease)   . Alcohol abuse   . Atrial flutter ([place]) 05/17/2016    Past Surgical History:  Procedure Laterality Date  . ABLATION OF DYSRHYTHMIC  FOCUS  05/17/2016   atrial flutter  . APPENDECTOMY  1988  . arotic valve replacement  12/2015  . COLONOSCOPY WITH PROPOFOL N/A 02/02/2017   Procedure: COLONOSCOPY WITH PROPOFOL;  Surgeon: Teena Irani, MD;  Location: [facility] END[facility]COPY;  Service: Endoscopy;  Laterality: N/A;  . ELECTROPHYSIOLOGIC STUDY N/A 05/17/2016   Procedure: A-Flutter Ablation;  Surgeon: Will Meredith Leeds, MD;  Location: [facility];  Service: Cardiovascular;  Laterality: N/A;  . INTRAMEDULLARY (IM) NAIL INTERTROCHANTERIC Right 01/29/2017   Procedure: INTRAMEDULLARY (IM) NAIL INTERTROCHANTRIC,ARTHROCENITIS RIGHT KNEE;  Surgeon: Nicholes Stairs, MD;  Location: [facility] ORS;  Service: Orthopedics;  Laterality: Right;  . [place] Medications    Prior to Admission medications   Medication Sig Start Date End Date Taking? Authorizing Provider  aspirin 81 MG chewable tablet Chew 81 mg by mouth daily. 01/03/16  Yes [provider]  ELIQUIS 5 MG TABS tablet Take 5 mg by mouth 2 (two) times daily.  04/19/16  Yes [provider]  fluticasone (FLONASE) 50 MCG/ACT nasal spray Place 2 sprays into both nostrils daily.   Yes [provider]  Iron-FA-B Cmp-C-Biot-Probiotic (FUSION PLUS) CAPS Take 1 capsule by mouth daily.   Yes [provider]  lisinopril (PRINIVIL,ZESTRIL) 20 MG tablet Take 20 mg by mouth daily.    Yes [provider]  metoprolol (LOPRESSOR)  100 MG tablet Take 100 mg by mouth 2 (two) times daily.  01/03/16  Yes [provider]  oxyCODONE (ROXICODONE) 5 MG immediate release tablet Take 1-2 tablets (5-10 mg total) by mouth every 4 (four) hours as needed for moderate pain. Patient taking differently: Take 5-10 mg by mouth every 6 (six) hours as needed for moderate pain or severe pain.  01/31/17 01/31/18 Yes Nicholes Stairs, MD  pravastatin (PRAVACHOL) 20 MG tablet Take 20 mg by mouth daily.    Yes [provider]  ferrous sulfate 325  (65 FE) MG tablet Take 1 tablet (325 mg total) by mouth 2 (two) times daily with a meal. 02/02/17   Patrecia Pour, MD  furosemide (LASIX) 40 MG tablet  02/09/17   [provider]  methocarbamol (ROBAXIN) 750 MG tablet  02/09/17   [provider]    Family History Family History  Problem Relation Age of Onset  . Depression Mother   . Diabetes Mellitus II Father     Social History Social History  Substance Use Topics  . Smoking status: Current Every Day Smoker    Packs/day: 1.00    Years: 30.00    Types: Cigarettes  . Smokeless tobacco: Never Used     Comment: uses vapor   . Alcohol use Yes     Comment: 3-4 beers per day     Allergies   Patient has no known allergies.   Review of Systems Review of Systems  Constitutional: Negative for fever.  HENT: Negative for sore throat.   Eyes: Negative for visual disturbance.  Respiratory: Negative for shortness of breath.   Cardiovascular: Negative for chest pain.  Gastrointestinal: Negative for abdominal pain.  Genitourinary: Negative for difficulty urinating.  Musculoskeletal: Positive for arthralgias. Negative for back pain and neck stiffness.  Skin: Negative for rash.  Neurological: Negative for syncope and headaches.     Physical Exam Updated Vital Signs BP 111/80   Pulse 78   Temp 98.2 F (36.8 C) (Oral)   Resp 16   Ht 5\' 6"  (1.676 m)   Wt 68 kg (150 lb)   SpO2 97%   BMI 24.21 kg/m   Physical Exam  Constitutional: He is oriented to person, place, and time. He appears well-developed and well-nourished. No distress.  HENT:  Head: Normocephalic and atraumatic.  Eyes: Conjunctivae and EOM are normal.  Neck: Normal range of motion.  Cardiovascular: Normal rate, regular rhythm, normal heart sounds and intact distal pulses.  Exam reveals no gallop and no friction rub.   No murmur heard. Pulmonary/Chest: Effort normal and breath sounds normal. No respiratory distress. He has no wheezes. He has no rales.   Abdominal: Soft. He exhibits no distension. There is no tenderness. There is no guarding.  Musculoskeletal: He exhibits edema (bilateral).  Mild erythema over right foot, contusion over right thigh, swelling around knee, no knee erythema, pt able to flex and extend knee, anterior/post drawers stable   Neurological: He is alert and oriented to person, place, and time.  Skin: Skin is warm and dry. He is not diaphoretic.  Nursing note and vitals reviewed.    ED Treatments / Results  Labs (all labs ordered are listed, but only abnormal results are displayed) Labs Reviewed - No data to display  EKG  EKG Interpretation None       Radiology Dg Ankle Complete Right  Result Date: 02/14/2017 CLINICAL DATA:  Right hip, knee, and ankle pain and swelling. Increased pain after fall  last night and physical therapy today. EXAM: RIGHT ANKLE - COMPLETE 3+ VIEW COMPARISON:  None. FINDINGS: There is no evidence of fracture, dislocation, or joint effusion. There is no evidence of arthropathy or other focal bone abnormality. The bones are under mineralized, possibly due to disuse. Mild soft tissue edema. IMPRESSION: Mild soft tissue edema.  No acute osseous abnormality. Electronically Signed   By: Jeb Levering M.D.   On: 02/14/2017 19:01   Dg Knee Complete 4 Views Right  Result Date: 02/14/2017 CLINICAL DATA:  Right hip, knee, and ankle pain and swelling. Increased pain after fall last night and physical therapy today. EXAM: RIGHT KNEE - COMPLETE 4+ VIEW COMPARISON:  Radiograph 01/30/2017 FINDINGS: No fracture or dislocation. Decrease joint effusion from prior. Minimal degenerative change with spurring of tibial spines. Mild soft tissue edema, also seen previously. IMPRESSION: Mild soft tissue edema. No acute fracture. Decreased joint effusion from prior. Electronically Signed   By: Jeb Levering M.D.   On: 02/14/2017 19:03   Dg Hip Unilat  With Pelvis 2-3 Views Right  Result Date:  02/14/2017 CLINICAL DATA:  Right hip and knee pain EXAM: DG HIP (WITH OR WITHOUT PELVIS) 2-3V RIGHT COMPARISON:  01/29/2017 FINDINGS: Intramedullary rod and dynamic compression screw in the proximal right femur is stable. There is 1 interlocking screw in the proximal femur. This transfixes an intertrochanteric femur fracture. There is anatomic alignment of the bony fragments. No breakage or loosening of the hardware. No new fractures are present. Mild degenerative change of the hip and SI joints. IMPRESSION: ORIF proximal right femur fracture. Electronically Signed   By: Marybelle Killings M.D.   On: 02/14/2017 19:03    Procedures Procedures (including critical care time)  Medications Ordered in ED Medications  acetaminophen (TYLENOL) tablet 1,000 mg (1,000 mg Oral Given 02/14/17 1918)  ibuprofen (ADVIL,MOTRIN) tablet 400 mg (400 mg Oral Given 02/14/17 1918)     Initial Impression / Assessment and Plan / ED Course  I have reviewed the triage vital signs and the nursing notes.  Pertinent labs & imaging results that were available during my care of the patient were reviewed by me and considered in my medical decision making (see chart for details).     51yo male with history of etoh abuse, aflutter s/p ablation, rheumatic heart disease s/p bovine AVR, PAD, on eliquis and ASA, chronic heart failure, recent hip fx presents with right knee pain after stumbling and twisting the knee.  XR without fracture.  No sign of septic arthritis.  Suspect either contusion, meniscal or less likely ligamentous injury.  Placed in knee sleeve, recommend follow up with orthopedics. Patient discharged in stable condition with understanding of reasons to return.    Final Clinical Impressions(s) / ED Diagnoses   Final diagnoses:  Acute pain of right knee, rule out meniscal injury    New Prescriptions Discharge Medication List as of 02/14/2017  8:15 PM       Gareth Morgan, MD 02/15/17 1842

## 2017-10-03 ENCOUNTER — Emergency Department (HOSPITAL_COMMUNITY): Payer: BLUE CROSS/BLUE SHIELD

## 2017-10-03 ENCOUNTER — Inpatient Hospital Stay (HOSPITAL_COMMUNITY)
Admission: EM | Admit: 2017-10-03 | Discharge: 2017-10-06 | DRG: 640 | Disposition: A | Discharge status: Home / Self Care | Payer: BLUE CROSS/BLUE SHIELD | Attending: Pulmonary Disease | Admitting: Pulmonary Disease

## 2017-10-03 ENCOUNTER — Encounter (HOSPITAL_COMMUNITY): Payer: Self-pay | Admitting: Emergency Medicine

## 2017-10-03 DIAGNOSIS — R740 Nonspecific elevation of levels of transaminase and lactic acid dehydrogenase [LDH]: Secondary | ICD-10-CM | POA: Diagnosis present

## 2017-10-03 DIAGNOSIS — I959 Hypotension, unspecified: Secondary | ICD-10-CM

## 2017-10-03 DIAGNOSIS — E440 Moderate protein-calorie malnutrition: Secondary | ICD-10-CM

## 2017-10-03 DIAGNOSIS — Z79899 Other long term (current) drug therapy: Secondary | ICD-10-CM

## 2017-10-03 DIAGNOSIS — E872 Acidosis: Principal | ICD-10-CM | POA: Diagnosis present

## 2017-10-03 DIAGNOSIS — D649 Anemia, unspecified: Secondary | ICD-10-CM | POA: Diagnosis present

## 2017-10-03 DIAGNOSIS — E861 Hypovolemia: Secondary | ICD-10-CM | POA: Diagnosis present

## 2017-10-03 DIAGNOSIS — R059 Cough, unspecified: Secondary | ICD-10-CM

## 2017-10-03 DIAGNOSIS — R571 Hypovolemic shock: Secondary | ICD-10-CM | POA: Diagnosis present

## 2017-10-03 DIAGNOSIS — R579 Shock, unspecified: Secondary | ICD-10-CM

## 2017-10-03 DIAGNOSIS — F1010 Alcohol abuse, uncomplicated: Secondary | ICD-10-CM | POA: Diagnosis present

## 2017-10-03 DIAGNOSIS — Y905 Blood alcohol level of 100-119 mg/100 ml: Secondary | ICD-10-CM | POA: Diagnosis present

## 2017-10-03 DIAGNOSIS — Z7901 Long term (current) use of anticoagulants: Secondary | ICD-10-CM

## 2017-10-03 DIAGNOSIS — F17210 Nicotine dependence, cigarettes, uncomplicated: Secondary | ICD-10-CM | POA: Diagnosis present

## 2017-10-03 DIAGNOSIS — E871 Hypo-osmolality and hyponatremia: Secondary | ICD-10-CM | POA: Diagnosis present

## 2017-10-03 DIAGNOSIS — Z953 Presence of xenogenic heart valve: Secondary | ICD-10-CM

## 2017-10-03 DIAGNOSIS — R0602 Shortness of breath: Secondary | ICD-10-CM

## 2017-10-03 DIAGNOSIS — K219 Gastro-esophageal reflux disease without esophagitis: Secondary | ICD-10-CM | POA: Diagnosis present

## 2017-10-03 DIAGNOSIS — I10 Essential (primary) hypertension: Secondary | ICD-10-CM | POA: Diagnosis present

## 2017-10-03 DIAGNOSIS — E782 Mixed hyperlipidemia: Secondary | ICD-10-CM | POA: Diagnosis present

## 2017-10-03 DIAGNOSIS — R05 Cough: Secondary | ICD-10-CM

## 2017-10-03 DIAGNOSIS — N179 Acute kidney failure, unspecified: Secondary | ICD-10-CM | POA: Diagnosis present

## 2017-10-03 DIAGNOSIS — I739 Peripheral vascular disease, unspecified: Secondary | ICD-10-CM | POA: Diagnosis present

## 2017-10-03 DIAGNOSIS — E876 Hypokalemia: Secondary | ICD-10-CM | POA: Diagnosis present

## 2017-10-03 DIAGNOSIS — I099 Rheumatic heart disease, unspecified: Secondary | ICD-10-CM | POA: Diagnosis present

## 2017-10-03 DIAGNOSIS — I4892 Unspecified atrial flutter: Secondary | ICD-10-CM | POA: Diagnosis present

## 2017-10-03 LAB — CBC WITH DIFFERENTIAL/PLATELET
Basophils Absolute: 0 10*3/uL (ref 0.0–0.1)
Basophils Relative: 1 %
Eosinophils Absolute: 0.1 10*3/uL (ref 0.0–0.7)
Eosinophils Relative: 1 %
HCT: 26.8 % — ABNORMAL LOW (ref 39.0–52.0)
Hemoglobin: 9.9 g/dL — ABNORMAL LOW (ref 13.0–17.0)
Lymphocytes Relative: 23 %
Lymphs Abs: 1.6 10*3/uL (ref 0.7–4.0)
MCH: 36 pg — ABNORMAL HIGH (ref 26.0–34.0)
MCHC: 36.9 g/dL — ABNORMAL HIGH (ref 30.0–36.0)
MCV: 97.5 fL (ref 78.0–100.0)
Monocytes Absolute: 1.1 10*3/uL — ABNORMAL HIGH (ref 0.1–1.0)
Monocytes Relative: 16 %
Neutro Abs: 4 10*3/uL (ref 1.7–7.7)
Neutrophils Relative %: 59 %
Platelets: 115 10*3/uL — ABNORMAL LOW (ref 150–400)
RBC: 2.75 MIL/uL — ABNORMAL LOW (ref 4.22–5.81)
RDW: 11.9 % (ref 11.5–15.5)
WBC: 6.7 10*3/uL (ref 4.0–10.5)

## 2017-10-03 LAB — COMPREHENSIVE METABOLIC PANEL
ALT: 68 U/L — ABNORMAL HIGH (ref 17–63)
AST: 99 U/L — ABNORMAL HIGH (ref 15–41)
Albumin: 3 g/dL — ABNORMAL LOW (ref 3.5–5.0)
Alkaline Phosphatase: 78 U/L (ref 38–126)
Anion gap: 19 — ABNORMAL HIGH (ref 5–15)
BUN: 34 mg/dL — ABNORMAL HIGH (ref 6–20)
CO2: 14 mmol/L — ABNORMAL LOW (ref 22–32)
Calcium: 8.2 mg/dL — ABNORMAL LOW (ref 8.9–10.3)
Chloride: 95 mmol/L — ABNORMAL LOW (ref 101–111)
Creatinine, Ser: 1.72 mg/dL — ABNORMAL HIGH (ref 0.61–1.24)
GFR calc Af Amer: 51 mL/min — ABNORMAL LOW (ref >60)
GFR calc non Af Amer: 44 mL/min — ABNORMAL LOW (ref >60)
Glucose, Bld: 89 mg/dL (ref 65–99)
Potassium: 3.3 mmol/L — ABNORMAL LOW (ref 3.5–5.1)
Sodium: 128 mmol/L — ABNORMAL LOW (ref 135–145)
Total Bilirubin: 1.1 mg/dL (ref 0.3–1.2)
Total Protein: 6.1 g/dL — ABNORMAL LOW (ref 6.5–8.1)

## 2017-10-03 LAB — I-STAT CG4 LACTIC ACID, ED
Lactic Acid, Venous: 3.02 mmol/L (ref 0.5–1.9)
Lactic Acid, Venous: 1.68 mmol/L (ref 0.5–1.9)

## 2017-10-03 LAB — I-STAT CHEM 8, ED
BUN: 29 mg/dL — ABNORMAL HIGH (ref 6–20)
CALCIUM ION: 1.02 mmol/L — AB (ref 1.15–1.40)
CREATININE: 1.6 mg/dL — AB (ref 0.61–1.24)
Chloride: 97 mmol/L — ABNORMAL LOW (ref 101–111)
Glucose, Bld: 81 mg/dL (ref 65–99)
HEMATOCRIT: 30 % — AB (ref 39.0–52.0)
HEMOGLOBIN: 10.2 g/dL — AB (ref 13.0–17.0)
Potassium: 3.3 mmol/L — ABNORMAL LOW (ref 3.5–5.1)
SODIUM: 129 mmol/L — AB (ref 135–145)
TCO2: 15 mmol/L — AB (ref 22–32)

## 2017-10-03 LAB — I-STAT TROPONIN, ED: Troponin i, poc: 0.03 ng/mL (ref 0.00–0.08)

## 2017-10-03 LAB — ETHANOL: Alcohol, Ethyl (B): 111 mg/dL — ABNORMAL HIGH (ref <10)

## 2017-10-03 MED ORDER — SODIUM CHLORIDE 0.9 % IV BOLUS (SEPSIS)
1000.0000 mL | Freq: Once | INTRAVENOUS | Status: AC
  Administered 2017-10-03: 1000 mL via INTRAVENOUS

## 2017-10-03 MED ORDER — HYDROCORTISONE NA SUCCINATE PF 100 MG IJ SOLR
100.0000 mg | Freq: Once | INTRAMUSCULAR | Status: AC
  Administered 2017-10-03: 100 mg via INTRAVENOUS
  Filled 2017-10-03: qty 2

## 2017-10-03 MED ORDER — NOREPINEPHRINE BITARTRATE 1 MG/ML IV SOLN
0.0000 ug/min | INTRAVENOUS | Status: DC
  Administered 2017-10-04: 2 ug/min via INTRAVENOUS
  Administered 2017-10-04: 10 ug/min via INTRAVENOUS
  Filled 2017-10-03 (×2): qty 4

## 2017-10-03 MED ORDER — LACTATED RINGERS IV SOLN
INTRAVENOUS | Status: DC
  Administered 2017-10-04: 01:00:00 via INTRAVENOUS

## 2017-10-03 MED ORDER — THIAMINE HCL 100 MG/ML IJ SOLN
100.0000 mg | Freq: Every day | INTRAMUSCULAR | Status: DC
  Administered 2017-10-04 – 2017-10-05 (×2): 100 mg via INTRAVENOUS
  Filled 2017-10-03 (×2): qty 1

## 2017-10-03 MED ORDER — SODIUM BICARBONATE 8.4 % IV SOLN
50.0000 meq | Freq: Once | INTRAVENOUS | Status: AC
  Administered 2017-10-04: 50 meq via INTRAVENOUS
  Filled 2017-10-03: qty 50

## 2017-10-03 MED ORDER — SODIUM CHLORIDE 0.9 % IV SOLN
Freq: Once | INTRAVENOUS | Status: AC
  Administered 2017-10-03: via INTRAVENOUS

## 2017-10-03 MED ORDER — NOREPINEPHRINE BITARTRATE 1 MG/ML IV SOLN
0.0000 ug/min | Freq: Once | INTRAVENOUS | Status: AC
  Administered 2017-10-03: 2 ug/min via INTRAVENOUS
  Filled 2017-10-03: qty 4

## 2017-10-03 MED ORDER — VANCOMYCIN HCL 10 G IV SOLR
1250.0000 mg | Freq: Once | INTRAVENOUS | Status: AC
  Administered 2017-10-03: 1250 mg via INTRAVENOUS
  Filled 2017-10-03: qty 1250

## 2017-10-03 MED ORDER — PIPERACILLIN-TAZOBACTAM 3.375 G IVPB 30 MIN
3.3750 g | Freq: Once | INTRAVENOUS | Status: AC
  Administered 2017-10-03: 3.375 g via INTRAVENOUS
  Filled 2017-10-03: qty 50

## 2017-10-03 MED ORDER — POTASSIUM CHLORIDE 10 MEQ/100ML IV SOLN
10.0000 meq | INTRAVENOUS | Status: AC
  Administered 2017-10-04 (×5): 10 meq via INTRAVENOUS
  Filled 2017-10-03 (×5): qty 100

## 2017-10-03 MED ORDER — VANCOMYCIN HCL IN DEXTROSE 1-5 GM/200ML-% IV SOLN: 1000.0000 mg | INTRAVENOUS | Status: DC

## 2017-10-03 MED ORDER — PIPERACILLIN-TAZOBACTAM 3.375 G IVPB
3.3750 g | Freq: Three times a day (TID) | INTRAVENOUS | Status: DC
  Filled 2017-10-03: qty 50

## 2017-10-03 MED ORDER — FOLIC ACID 5 MG/ML IJ SOLN
1.0000 mg | Freq: Every day | INTRAMUSCULAR | Status: DC
  Administered 2017-10-04 – 2017-10-05 (×2): 1 mg via INTRAVENOUS
  Filled 2017-10-03 (×2): qty 0.2

## 2017-10-03 NOTE — ED Notes (Signed)
Back from xray

## 2017-10-03 NOTE — ED Notes (Signed)
2nd and 3rd bag of NS IV bolus infusing , Levophed drip held by Dr. Jimmy Footman , Zosyn IV antibiotic infusing.

## 2017-10-03 NOTE — ED Notes (Signed)
Levophed drip restarted per EDP for persistent hypotension despite 4 liters of NS IV bolus , waiting for admitting MD , alert and oriented , denies pain /respirations unlabored , IV sites intact .

## 2017-10-03 NOTE — Progress Notes (Signed)
Pharmacy Antibiotic Note  Malik Lambert is a 52 y.o. male admitted on 10/03/2017 with sepsis.  Pharmacy has been consulted for vancomycin and zosyn dosing.  Came in with hypotension, mild fatigue, and generalized weakness. WBC WNL. Afebrile. LA is 3.02. Renal function is reduced from baseline (currently Scr 1.6; baseline Scr ~0.7, CrCl ~48 mL/min). Orders for one time zosyn and vancomycin received in ED.  Plan: Vancomycin 1000 mg IV every 24 hours.  Goal trough 15-20 mcg/mL. Zosyn 3.375g IV q8h (4 hour infusion). Monitor renal function, cx results, clinical picture, and VT as needed  Height: 5\' 6"  (167.6 cm) Weight: 140 lb (63.5 kg) IBW/kg (Calculated) : 63.8  Temp (24hrs), Avg:97.9 F (36.6 C), Min:97.9 F (36.6 C), Max:97.9 F (36.6 C)  Recent Labs  Lab 10/03/17 2018 10/03/17 2025 10/03/17 2026  WBC 6.7  --   --   CREATININE 1.72* 1.60*  --   LATICACIDVEN  --   --  3.02*    Estimated Creatinine Clearance: 48.5 mL/min (A) (by C-G formula based on SCr of 1.6 mg/dL (H)).    No Known Allergies  Antimicrobials this admission: Vancomycin 2/27 >>  Zosyn 2/27 >>   Dose adjustments this admission: N/A  Microbiology results: 2/27 BCx: sent  Thank you for allowing pharmacy to be a part of this patient's care.  Doylene Canard, PharmD Clinical Pharmacist  Pager: [phone] Phone: [phone] 10/03/2017 9:26 PM

## 2017-10-03 NOTE — ED Notes (Signed)
PA notified on pt.'s hypotension , NS IV bolus infusing , Vancomycin IV infusing , alert/oriented , respirations unlabored , IV sites intact .

## 2017-10-03 NOTE — ED Notes (Signed)
NS IV bolus infusing , IV sites intact , EDP notified on pt.'s hypotension .

## 2017-10-03 NOTE — ED Notes (Signed)
Alex PA and Dr.Floyd aware of hypotension; 3L NS hung

## 2017-10-03 NOTE — ED Triage Notes (Addendum)
Patient arrived with EMS from home reports hypotension today systolic 93'T with mild fatigue and generalized weakness this week . He received Epi drip by EMS and NS 600 ml bolus .

## 2017-10-03 NOTE — ED Notes (Signed)
4th bag NS IV bolus infusing /Vancomycin IV antibiotic infusing , denies pain /respirations unlabored , IV sites intact.

## 2017-10-03 NOTE — ED Provider Notes (Signed)
[facility] EMERGENCY DEPARTMENT Provider Note   CSN: [id number] Arrival date & time: 10/03/17  1932     History   Chief Complaint Chief Complaint  Patient presents with  . Hypotension    HPI Malik Lambert is a 52 y.o. male with history of alcohol abuse, hypertension, atrial flutter status post ablation, aortic valve replacement anticoagulated on Eliquis who presents with hypotension and near syncope.  Patient reports he has had several episodes of feeling like he is going to pass out starting this morning.  Reports he has had cough and shortness of breath for the past 3-4 days.  He also reports decreased appetite over the past 1 week.  He reports he was starting to feel depressed which caused him to decrease his PO input.  He denies any SI or HI.  He denies taking any medications outside their normal dosing.  He reports drinking 4-5 beers daily and admits to having 2 vodka drinks only today.  He reports he fell a few times when he felt like he is going to pass out, but did not hit his head or lose consciousness.  He denies any injuries.  He denies any chest pain, abdominal pain, nausea, vomiting, urinary symptoms.  He denies any recreational drug use.  HPI  Past Medical History:  Diagnosis Date  . Acid reflux   . Alcohol abuse   . Anxiety disorder   . Aortic stenosis   . Aortic valve defect   . Ascending aortic aneurysm ([place])   . Atrial flutter ([place])   . Chronic constipation   . Dyspnea   . GERD (gastroesophageal reflux disease)   . Heart murmur   . Heart murmur   . High risk medication use   . Hypertension, essential   . LVH (left ventricular hypertrophy)   . Mixed hyperlipidemia   . PAD (peripheral artery disease) ([place])   . Rheumatic fever/heart disease   . Vitamin D deficiency     Patient Active Problem List   Diagnosis Date Noted  . Sinus tachycardia   . H/O aortic valve replacement   . Displaced intertrochanteric fracture of right femur, initial  encounter for closed fracture ([place]) 01/27/2017  . Closed displaced intertrochanteric fracture of right femur ([place]) 01/27/2017  . Aortic stenosis   . Hypertension, essential   . Mixed hyperlipidemia   . PAD (peripheral artery disease) ([place])   . GERD (gastroesophageal reflux disease)   . Alcohol abuse   . Atrial flutter ([place]) 05/17/2016    Past Surgical History:  Procedure Laterality Date  . ABLATION OF DYSRHYTHMIC FOCUS  05/17/2016   atrial flutter  . AORTIC VALVE REPLACEMENT    . APPENDECTOMY  1988  . arotic valve replacement  12/2015  . COLONOSCOPY WITH PROPOFOL N/A 02/02/2017   Procedure: COLONOSCOPY WITH PROPOFOL;  Surgeon: Teena Irani, MD;  Location: [facility];  Service: Endoscopy;  Laterality: N/A;  . ELECTROPHYSIOLOGIC STUDY N/A 05/17/2016   Procedure: A-Flutter Ablation;  Surgeon: Will Meredith Leeds, MD;  Location: [facility];  Service: Cardiovascular;  Laterality: N/A;  . INTRAMEDULLARY (IM) NAIL INTERTROCHANTERIC Right 01/29/2017   Procedure: INTRAMEDULLARY (IM) NAIL INTERTROCHANTRIC,ARTHROCENITIS RIGHT KNEE;  Surgeon: Nicholes Stairs, MD;  Location: [facility];  Service: Orthopedics;  Laterality: Right;  . [place] Medications    Prior to Admission medications   Medication Sig Start Date End Date Taking? Authorizing Provider  ELIQUIS 5 MG TABS tablet Take 5  mg by mouth 2 (two) times daily.  04/19/16  Yes [provider]  FLUoxetine (PROZAC) 20 MG capsule Take 20 mg by mouth daily. 09/05/17  Yes [provider]  lisinopril (PRINIVIL,ZESTRIL) 20 MG tablet Take 20 mg by mouth daily.    Yes [provider]  methocarbamol (ROBAXIN) 750 MG tablet Take 750 mg by mouth every morning.  02/09/17  Yes [provider]  metoprolol (LOPRESSOR) 100 MG tablet Take 100 mg by mouth 2 (two) times daily.  01/03/16  Yes [provider]  pravastatin (PRAVACHOL) 20 MG tablet Take 20 mg by mouth daily.    Yes [provider]  ferrous sulfate 325 (65 FE) MG tablet Take 1 tablet (325 mg total) by mouth 2 (two) times daily with a meal. Patient not taking: Reported on 10/03/2017 02/02/17   Patrecia Pour, MD    Family History Family History  Problem Relation Age of Onset  . Depression Mother   . Diabetes Mellitus II Father     Social History Social History   Tobacco Use  . Smoking status: Current Every Day Smoker    Packs/day: 1.00    Years: 30.00    Pack years: 30.00    Types: Cigarettes  . Smokeless tobacco: Never Used  . Tobacco comment: uses vapor   Substance Use Topics  . Alcohol use: Yes    Comment: 3-4 beers per day  . Drug use: No     Allergies   Patient has no known allergies.   Review of Systems Review of Systems  Constitutional: Negative for chills and fever.  HENT: Positive for congestion. Negative for facial swelling and sore throat.   Respiratory: Positive for cough and shortness of breath.   Cardiovascular: Negative for chest pain.  Gastrointestinal: Negative for abdominal pain, nausea and vomiting.  Genitourinary: Negative for dysuria.  Musculoskeletal: Negative for back pain.  Skin: Negative for rash and wound.  Neurological: Positive for light-headedness. Negative for headaches.  Psychiatric/Behavioral: The patient is not nervous/anxious.      Physical Exam Updated Vital Signs BP 118/86   Pulse 73   Temp 97.9 F (36.6 C) (Temporal)   Resp 14   Ht 5\' 6"  (1.676 m)   Wt 63.5 kg (140 lb)   SpO2 100%   BMI 22.60 kg/m   Physical Exam  Constitutional: He appears well-developed and well-nourished. No distress.  HENT:  Head: Normocephalic and atraumatic.  Mouth/Throat: Oropharynx is clear and moist. No oropharyngeal exudate.  Eyes: Conjunctivae are normal. Pupils are equal, round, and reactive to light. Right eye exhibits no discharge. Left eye exhibits no discharge. No scleral icterus.  Neck: Normal range of motion. Neck supple. No thyromegaly present.   Cardiovascular: Normal rate, regular rhythm, normal heart sounds and intact distal pulses. Exam reveals no gallop and no friction rub.  No murmur heard. Faint peripheral pulses, improved after fluid bolus  Pulmonary/Chest: Effort normal and breath sounds normal. No stridor. No respiratory distress. He has no wheezes. He has no rales.  Abdominal: Soft. Bowel sounds are normal. He exhibits no distension. There is no tenderness. There is no rebound and no guarding.  Musculoskeletal: He exhibits no edema.  Lymphadenopathy:    He has no cervical adenopathy.  Neurological: He is alert. Coordination normal.  Skin: Skin is warm and dry. No rash noted. He is not diaphoretic. No pallor.  Cool hands and feet  Psychiatric: He has a normal mood and affect.  Nursing note and vitals reviewed.  ED Treatments / Results  Labs (all labs ordered are listed, but only abnormal results are displayed) Labs Reviewed  COMPREHENSIVE METABOLIC PANEL - Abnormal; Notable for the following components:      Result Value   Sodium 128 (*)    Potassium 3.3 (*)    Chloride 95 (*)    CO2 14 (*)    BUN 34 (*)    Creatinine, Ser 1.72 (*)    Calcium 8.2 (*)    Total Protein 6.1 (*)    Albumin 3.0 (*)    AST 99 (*)    ALT 68 (*)    GFR calc non Af Amer 44 (*)    GFR calc Af Amer 51 (*)    Anion gap 19 (*)    All other components within normal limits  CBC WITH DIFFERENTIAL/PLATELET - Abnormal; Notable for the following components:   RBC 2.75 (*)    Hemoglobin 9.9 (*)    HCT 26.8 (*)    MCH 36.0 (*)    MCHC 36.9 (*)    Platelets 115 (*)    Monocytes Absolute 1.1 (*)    All other components within normal limits  ETHANOL - Abnormal; Notable for the following components:   Alcohol, Ethyl (B) 111 (*)    All other components within normal limits  I-STAT CG4 LACTIC ACID, ED - Abnormal; Notable for the following components:   Lactic Acid, Venous 3.02 (*)    All other components within normal limits  I-STAT  CHEM 8, ED - Abnormal; Notable for the following components:   Sodium 129 (*)    Potassium 3.3 (*)    Chloride 97 (*)    BUN 29 (*)    Creatinine, Ser 1.60 (*)    Calcium, Ion 1.02 (*)    TCO2 15 (*)    Hemoglobin 10.2 (*)    HCT 30.0 (*)    All other components within normal limits  CULTURE, BLOOD (ROUTINE X 2)  CULTURE, BLOOD (ROUTINE X 2)  RESPIRATORY PANEL BY PCR  URINALYSIS, ROUTINE W REFLEX MICROSCOPIC  RAPID URINE DRUG SCREEN, HOSP PERFORMED  INFLUENZA PANEL BY PCR (TYPE A & B)  PROCALCITONIN  PROCALCITONIN  BASIC METABOLIC PANEL  CBC  MAGNESIUM  PHOSPHORUS  MAGNESIUM  PHOSPHORUS  I-STAT TROPONIN, ED  I-STAT CG4 LACTIC ACID, ED    EKG  EKG Interpretation  Date/Time:  Wednesday October 03 2017 19:39:45 EST Ventricular Rate:  84 PR Interval:    QRS Duration: 100 QT Interval:  601 QTC Calculation: 711 R Axis:   60 Text Interpretation:  Sinus rhythm Short PR interval Anteroseptal infarct, old Nonspecific T abnormalities, lateral leads ST elevation, consider inferior injury Prolonged QT interval background noise Otherwise no significant change Confirmed by Deno Etienne [id number]) on 10/03/2017 7:58:30 PM       Radiology Dg Chest Port 1 View  Result Date: 10/03/2017 CLINICAL DATA:  Cough for 3 days EXAM: PORTABLE CHEST 1 VIEW COMPARISON:  None. FINDINGS: No focal pulmonary opacity or pleural effusion. Post sternotomy changes and valve prosthesis. Aortic atherosclerosis. Normal heart size. No pneumothorax. Old left fifth rib fracture IMPRESSION: No active disease. Electronically Signed   By: Donavan Foil M.D.   On: 10/03/2017 20:14    Procedures Procedures (including critical care time)  CRITICAL CARE Performed by: Frederica Kuster   Total critical care time: 30 minutes  Critical care time was exclusive of separately billable procedures and treating other patients.  Critical care was necessary to treat or  prevent imminent or life-threatening  deterioration.  Critical care was time spent personally by me on the following activities: development of treatment plan with patient and/or surrogate as well as nursing, discussions with consultants, evaluation of patient's response to treatment, examination of patient, obtaining history from patient or surrogate, ordering and performing treatments and interventions, ordering and review of laboratory studies, ordering and review of radiographic studies, pulse oximetry and re-evaluation of patient's condition.  Sepsis - Repeat Assessment  Performed at:    2130  Vitals     Blood pressure 78/61, pulse 70, temperature 97.9 F (36.6 C), temperature source Temporal, resp. rate 16, height 5\' 6"  (1.676 m), weight 63.5 kg (140 lb), SpO2 100 %.  Heart:     Regular rate and rhythm  Lungs:    CTA  Capillary Refill:   > 2 sec  Peripheral Pulse:   Dorsalis pedis pulse  palpable faint  Skin:     Pale  Sepsis - Repeat Assessment following norepinephrine 10 mcg/min  Performed at:    2336  Vitals     Blood pressure 118/86, pulse 73, temperature 97.9 F (36.6 C), temperature source Temporal, resp. rate 14, height 5\' 6"  (1.676 m), weight 63.5 kg (140 lb), SpO2 100 %.  Heart:     Regular rate and rhythm  Lungs:    CTA  Capillary Refill:   <2 sec  Peripheral Pulse:   Dorsalis pedis pulse  palpable strong  Skin:     Normal Color    .    Medications Ordered in ED Medications  piperacillin-tazobactam (ZOSYN) IVPB 3.375 g (not administered)  vancomycin (VANCOCIN) IVPB 1000 mg/200 mL premix (not administered)  sodium bicarbonate injection 50 mEq (not administered)  norepinephrine (LEVOPHED) 4 mg in dextrose 5 % 250 mL (0.016 mg/mL) infusion (not administered)  potassium chloride 10 mEq in 100 mL IVPB (not administered)  folic acid injection 1 mg (not administered)  thiamine (B-1) injection 100 mg (not administered)  lactated ringers infusion (not administered)  sodium chloride 0.9 % bolus  1,000 mL (0 mLs Intravenous Stopped 10/03/17 2041)  sodium chloride 0.9 % bolus 1,000 mL (0 mLs Intravenous Stopped 10/03/17 2121)  sodium chloride 0.9 % bolus 1,000 mL (0 mLs Intravenous Stopped 10/03/17 2151)  sodium chloride 0.9 % bolus 1,000 mL (0 mLs Intravenous Stopped 10/03/17 2121)  norepinephrine (LEVOPHED) 4 mg in dextrose 5 % 250 mL (0.016 mg/mL) infusion (10 mcg/min Intravenous Rate/Dose Change 10/03/17 2255)  hydrocortisone sodium succinate (SOLU-CORTEF) 100 MG injection 100 mg (100 mg Intravenous Given 10/03/17 2049)  vancomycin (VANCOCIN) 1,250 mg in sodium chloride 0.9 % 250 mL IVPB (0 mg Intravenous Stopped 10/03/17 2256)  piperacillin-tazobactam (ZOSYN) IVPB 3.375 g (0 g Intravenous Stopped 10/03/17 2122)  0.9 %  sodium chloride infusion ( Intravenous New Bag/Given 10/03/17 2356)     Initial Impression / Assessment and Plan / ED Course  I have reviewed the triage vital signs and the nursing notes.  Pertinent labs & imaging results that were available during my care of the patient were reviewed by me and considered in my medical decision making (see chart for details).  Clinical Course as of Oct 04 2  Wed Oct 03, 2017  2332 Pulse Rate: 75 [AL]  2334 MAP (mmHg): 97 [AL]    Clinical Course User Index [AL] Frederica Kuster, PA-C    Patient with undifferentiated shock.  Code sepsis called considering hypotension, and concern for infection with cough.  Vancomycin, Zosyn initiated.  Patient given total  of 4600 mL of fluid, per recommendation by intensivist, Dr. Dr. Jimmy Footman, without any significant change in his blood pressure.  Levophed initiated at 10 mcg/min, which significantly improved blood pressure.  Patient also given stress dose of hydrocortisone, 100 mg.  Switched to continuous infusion of normal saline at 75 mL/h.  Persists at 2 L of oxygen and satting 100%.  Patient denies shortness of breath now, however he states he may feel short of breath if he tried to get up.  Chest  x-ray is negative.  Patient does have AKI, BUN 34, creatinine 1.72.  Sodium 128, potassium 3.3, chloride 95, protein 6.1, albumin 3.0, bicarb 14.  Lactic initially 3.0, but cleared after fluids to 1.68.  Initial troponin 0.03.  Hemoglobin 9.9, however this is higher than any hemoglobin on record for the patient (most recently 8.0 8 months ago).  Urine is pending blood cultures pending.  Bedside ultrasound conducted by Dr. Tyrone Nine, see his note for further description findings.  Sepsis of possibility, however could also be related to hypovolemic shock considering patient has had decreased PO intake for the past week and has continued to drink alcohol.  Critical care was called and will admit the patient considering now requiring Levophed drip to stabilize blood pressure.  Patient also evaluated by Dr. Tyrone Nine who guided the patient's management and agrees with plan.  Final Clinical Impressions(s) / ED Diagnoses   Final diagnoses:  Shock ([place])  Hypotension, unspecified hypotension type  Cough  Shortness of breath    ED Discharge Orders    None       Frederica Kuster, PA-C 10/04/17 [place], [place], DO 10/04/17 1610

## 2017-10-03 NOTE — ED Notes (Signed)
Patient given urinal , unable to urinate at this time .

## 2017-10-03 NOTE — ED Notes (Signed)
Unable to collect blood specimen due to collapsed veins -EDP notified .

## 2017-10-04 ENCOUNTER — Encounter (HOSPITAL_COMMUNITY): Payer: Self-pay | Admitting: *Deleted

## 2017-10-04 ENCOUNTER — Other Ambulatory Visit: Payer: Self-pay

## 2017-10-04 DIAGNOSIS — I959 Hypotension, unspecified: Secondary | ICD-10-CM

## 2017-10-04 DIAGNOSIS — R740 Nonspecific elevation of levels of transaminase and lactic acid dehydrogenase [LDH]: Secondary | ICD-10-CM | POA: Diagnosis present

## 2017-10-04 DIAGNOSIS — E876 Hypokalemia: Secondary | ICD-10-CM | POA: Diagnosis present

## 2017-10-04 DIAGNOSIS — F1010 Alcohol abuse, uncomplicated: Secondary | ICD-10-CM | POA: Diagnosis not present

## 2017-10-04 DIAGNOSIS — K219 Gastro-esophageal reflux disease without esophagitis: Secondary | ICD-10-CM | POA: Diagnosis present

## 2017-10-04 DIAGNOSIS — E440 Moderate protein-calorie malnutrition: Secondary | ICD-10-CM | POA: Diagnosis not present

## 2017-10-04 DIAGNOSIS — I4892 Unspecified atrial flutter: Secondary | ICD-10-CM | POA: Diagnosis present

## 2017-10-04 DIAGNOSIS — E861 Hypovolemia: Secondary | ICD-10-CM | POA: Diagnosis not present

## 2017-10-04 DIAGNOSIS — I099 Rheumatic heart disease, unspecified: Secondary | ICD-10-CM | POA: Diagnosis present

## 2017-10-04 DIAGNOSIS — E782 Mixed hyperlipidemia: Secondary | ICD-10-CM | POA: Diagnosis present

## 2017-10-04 DIAGNOSIS — F10239 Alcohol dependence with withdrawal, unspecified: Secondary | ICD-10-CM | POA: Diagnosis not present

## 2017-10-04 DIAGNOSIS — Z7901 Long term (current) use of anticoagulants: Secondary | ICD-10-CM | POA: Diagnosis not present

## 2017-10-04 DIAGNOSIS — R571 Hypovolemic shock: Secondary | ICD-10-CM | POA: Diagnosis present

## 2017-10-04 DIAGNOSIS — I10 Essential (primary) hypertension: Secondary | ICD-10-CM | POA: Diagnosis present

## 2017-10-04 DIAGNOSIS — D649 Anemia, unspecified: Secondary | ICD-10-CM | POA: Diagnosis present

## 2017-10-04 DIAGNOSIS — R05 Cough: Secondary | ICD-10-CM

## 2017-10-04 DIAGNOSIS — Z79899 Other long term (current) drug therapy: Secondary | ICD-10-CM | POA: Diagnosis not present

## 2017-10-04 DIAGNOSIS — F17210 Nicotine dependence, cigarettes, uncomplicated: Secondary | ICD-10-CM | POA: Diagnosis present

## 2017-10-04 DIAGNOSIS — N179 Acute kidney failure, unspecified: Secondary | ICD-10-CM | POA: Diagnosis present

## 2017-10-04 DIAGNOSIS — E871 Hypo-osmolality and hyponatremia: Secondary | ICD-10-CM | POA: Diagnosis present

## 2017-10-04 DIAGNOSIS — Z953 Presence of xenogenic heart valve: Secondary | ICD-10-CM | POA: Diagnosis not present

## 2017-10-04 DIAGNOSIS — R579 Shock, unspecified: Secondary | ICD-10-CM

## 2017-10-04 DIAGNOSIS — I739 Peripheral vascular disease, unspecified: Secondary | ICD-10-CM | POA: Diagnosis present

## 2017-10-04 DIAGNOSIS — E872 Acidosis: Secondary | ICD-10-CM | POA: Diagnosis present

## 2017-10-04 DIAGNOSIS — G9340 Encephalopathy, unspecified: Secondary | ICD-10-CM | POA: Diagnosis not present

## 2017-10-04 DIAGNOSIS — Y905 Blood alcohol level of 100-119 mg/100 ml: Secondary | ICD-10-CM | POA: Diagnosis present

## 2017-10-04 LAB — URINALYSIS, ROUTINE W REFLEX MICROSCOPIC
BILIRUBIN URINE: NEGATIVE
Glucose, UA: NEGATIVE mg/dL
Hgb urine dipstick: NEGATIVE
Ketones, ur: 20 mg/dL — AB
LEUKOCYTES UA: NEGATIVE
NITRITE: NEGATIVE
Protein, ur: NEGATIVE mg/dL
SPECIFIC GRAVITY, URINE: 1.012 (ref 1.005–1.030)
pH: 5 (ref 5.0–8.0)

## 2017-10-04 LAB — GLUCOSE, CAPILLARY
GLUCOSE-CAPILLARY: 121 mg/dL — AB (ref 65–99)
GLUCOSE-CAPILLARY: 204 mg/dL — AB (ref 65–99)
GLUCOSE-CAPILLARY: 149 mg/dL — AB (ref 65–99)
Glucose-Capillary: 90 mg/dL (ref 65–99)
Glucose-Capillary: 165 mg/dL — ABNORMAL HIGH (ref 65–99)

## 2017-10-04 LAB — RESPIRATORY PANEL BY PCR
ADENOVIRUS-RVPPCR: NOT DETECTED
Bordetella pertussis: NOT DETECTED
CORONAVIRUS NL63-RVPPCR: NOT DETECTED
CORONAVIRUS OC43-RVPPCR: NOT DETECTED
Chlamydophila pneumoniae: NOT DETECTED
Coronavirus 229E: NOT DETECTED
Coronavirus HKU1: NOT DETECTED
INFLUENZA B-RVPPCR: NOT DETECTED
Influenza A: NOT DETECTED
MYCOPLASMA PNEUMONIAE-RVPPCR: NOT DETECTED
Metapneumovirus: NOT DETECTED
PARAINFLUENZA VIRUS 1-RVPPCR: NOT DETECTED
PARAINFLUENZA VIRUS 3-RVPPCR: NOT DETECTED
PARAINFLUENZA VIRUS 4-RVPPCR: NOT DETECTED
Parainfluenza Virus 2: NOT DETECTED
RHINOVIRUS / ENTEROVIRUS - RVPPCR: NOT DETECTED
Respiratory Syncytial Virus: NOT DETECTED

## 2017-10-04 LAB — BASIC METABOLIC PANEL
ANION GAP: 10 (ref 5–15)
BUN: 22 mg/dL — ABNORMAL HIGH (ref 6–20)
CO2: 19 mmol/L — AB (ref 22–32)
Calcium: 7.3 mg/dL — ABNORMAL LOW (ref 8.9–10.3)
Chloride: 100 mmol/L — ABNORMAL LOW (ref 101–111)
Creatinine, Ser: 1.05 mg/dL (ref 0.61–1.24)
GFR calc Af Amer: >60 mL/min (ref >60)
GFR calc non Af Amer: >60 mL/min (ref >60)
GLUCOSE: 156 mg/dL — AB (ref 65–99)
POTASSIUM: 3.9 mmol/L (ref 3.5–5.1)
Sodium: 129 mmol/L — ABNORMAL LOW (ref 135–145)

## 2017-10-04 LAB — POCT I-STAT 3, ART BLOOD GAS (G3+)
Acid-base deficit: 12 mmol/L — ABNORMAL HIGH (ref 0.0–2.0)
BICARBONATE: 12.1 mmol/L — AB (ref 20.0–28.0)
O2 SAT: 97 %
PCO2 ART: 23.3 mmHg — AB (ref 32.0–48.0)
PH ART: 7.325 — AB (ref 7.350–7.450)
Patient temperature: 98.6
TCO2: 13 mmol/L — ABNORMAL LOW (ref 22–32)
pO2, Arterial: 97 mmHg (ref 83.0–108.0)

## 2017-10-04 LAB — CBC
HEMATOCRIT: 23 % — AB (ref 39.0–52.0)
HEMOGLOBIN: 8.4 g/dL — AB (ref 13.0–17.0)
MCH: 35.6 pg — AB (ref 26.0–34.0)
MCHC: 36.5 g/dL — AB (ref 30.0–36.0)
MCV: 97.5 fL (ref 78.0–100.0)
Platelets: 97 10*3/uL — ABNORMAL LOW (ref 150–400)
RBC: 2.36 MIL/uL — ABNORMAL LOW (ref 4.22–5.81)
RDW: 11.9 % (ref 11.5–15.5)
WBC: 4.8 10*3/uL (ref 4.0–10.5)

## 2017-10-04 LAB — INFLUENZA PANEL BY PCR (TYPE A & B)
Influenza A By PCR: NEGATIVE
Influenza B By PCR: NEGATIVE

## 2017-10-04 LAB — HEPATIC FUNCTION PANEL
ALBUMIN: 2.4 g/dL — AB (ref 3.5–5.0)
ALBUMIN: 2.4 g/dL — AB (ref 3.5–5.0)
ALK PHOS: 75 U/L (ref 38–126)
ALT: 66 U/L — AB (ref 17–63)
ALT: 78 U/L — AB (ref 17–63)
AST: 126 U/L — AB (ref 15–41)
AST: 140 U/L — ABNORMAL HIGH (ref 15–41)
Alkaline Phosphatase: 114 U/L (ref 38–126)
BILIRUBIN INDIRECT: 0.7 mg/dL (ref 0.3–0.9)
BILIRUBIN TOTAL: 1 mg/dL (ref 0.3–1.2)
Bilirubin, Direct: 0.3 mg/dL (ref 0.1–0.5)
Bilirubin, Direct: 0.4 mg/dL (ref 0.1–0.5)
Indirect Bilirubin: 0.7 mg/dL (ref 0.3–0.9)
TOTAL PROTEIN: 4.7 g/dL — AB (ref 6.5–8.1)
Total Bilirubin: 1.1 mg/dL (ref 0.3–1.2)
Total Protein: 4.9 g/dL — ABNORMAL LOW (ref 6.5–8.1)

## 2017-10-04 LAB — RAPID URINE DRUG SCREEN, HOSP PERFORMED
AMPHETAMINES: NOT DETECTED
Barbiturates: NOT DETECTED
Benzodiazepines: NOT DETECTED
Cocaine: NOT DETECTED
Opiates: NOT DETECTED
Tetrahydrocannabinol: NOT DETECTED

## 2017-10-04 LAB — MAGNESIUM
Magnesium: 1.4 mg/dL — ABNORMAL LOW (ref 1.7–2.4)
Magnesium: 2.3 mg/dL (ref 1.7–2.4)

## 2017-10-04 LAB — PROCALCITONIN
Procalcitonin: 0.11 ng/mL
Procalcitonin: <0.1 ng/mL

## 2017-10-04 LAB — ACETAMINOPHEN LEVEL

## 2017-10-04 LAB — SALICYLATE LEVEL: Salicylate Lvl: <7 mg/dL (ref 2.8–30.0)

## 2017-10-04 LAB — PHOSPHORUS
Phosphorus: 2.2 mg/dL — ABNORMAL LOW (ref 2.5–4.6)
Phosphorus: 2.8 mg/dL (ref 2.5–4.6)

## 2017-10-04 LAB — AMMONIA: Ammonia: 11 umol/L (ref 9–35)

## 2017-10-04 LAB — LACTIC ACID, PLASMA: LACTIC ACID, VENOUS: 1 mmol/L (ref 0.5–1.9)

## 2017-10-04 LAB — MRSA PCR SCREENING: MRSA BY PCR: NEGATIVE

## 2017-10-04 MED ORDER — PANTOPRAZOLE SODIUM 40 MG IV SOLR
40.0000 mg | INTRAVENOUS | Status: DC
  Administered 2017-10-04 – 2017-10-05 (×2): 40 mg via INTRAVENOUS
  Filled 2017-10-04 (×2): qty 40

## 2017-10-04 MED ORDER — DEXMEDETOMIDINE HCL IN NACL 400 MCG/100ML IV SOLN: 0.4000 ug/kg/h | INTRAVENOUS | Status: DC

## 2017-10-04 MED ORDER — HALOPERIDOL LACTATE 5 MG/ML IJ SOLN: 5.0000 mg | Freq: Four times a day (QID) | INTRAMUSCULAR | Status: DC | PRN

## 2017-10-04 MED ORDER — PHENOBARBITAL SODIUM 130 MG/ML IJ SOLN: 130.0000 mg | INTRAMUSCULAR | Status: DC | PRN

## 2017-10-04 MED ORDER — MAGNESIUM SULFATE 4 GM/100ML IV SOLN
4.0000 g | Freq: Once | INTRAVENOUS | Status: AC
  Administered 2017-10-04: 4 g via INTRAVENOUS
  Filled 2017-10-04 (×2): qty 100

## 2017-10-04 MED ORDER — LACTATED RINGERS IV BOLUS (SEPSIS)
1000.0000 mL | Freq: Once | INTRAVENOUS | Status: AC
  Administered 2017-10-04: 1000 mL via INTRAVENOUS

## 2017-10-04 MED ORDER — ADULT MULTIVITAMIN W/MINERALS CH
1.0000 | ORAL_TABLET | Freq: Every day | ORAL | Status: DC
  Administered 2017-10-04 – 2017-10-06 (×3): 1 via ORAL
  Filled 2017-10-04 (×3): qty 1

## 2017-10-04 MED ORDER — APIXABAN 5 MG PO TABS
5.0000 mg | ORAL_TABLET | Freq: Two times a day (BID) | ORAL | Status: DC
  Administered 2017-10-04 – 2017-10-06 (×5): 5 mg via ORAL
  Filled 2017-10-04 (×6): qty 1

## 2017-10-04 MED ORDER — LORAZEPAM 2 MG/ML IJ SOLN: 2.0000 mg | INTRAMUSCULAR | Status: DC | PRN

## 2017-10-04 MED ORDER — PHENOBARBITAL SODIUM 130 MG/ML IJ SOLN: 260.0000 mg | Freq: Every day | INTRAMUSCULAR | Status: DC

## 2017-10-04 MED ORDER — ENSURE ENLIVE PO LIQD
237.0000 mL | Freq: Two times a day (BID) | ORAL | Status: DC
  Administered 2017-10-04: 237 mL via ORAL

## 2017-10-04 MED ORDER — HEPARIN SODIUM (PORCINE) 5000 UNIT/ML IJ SOLN
5000.0000 [IU] | Freq: Three times a day (TID) | INTRAMUSCULAR | Status: DC
  Administered 2017-10-04: 5000 [IU] via SUBCUTANEOUS
  Filled 2017-10-04 (×2): qty 1

## 2017-10-04 MED ORDER — FLUOXETINE HCL 20 MG PO CAPS
20.0000 mg | ORAL_CAPSULE | Freq: Every day | ORAL | Status: DC
  Administered 2017-10-04 – 2017-10-06 (×3): 20 mg via ORAL
  Filled 2017-10-04 (×3): qty 1

## 2017-10-04 MED ORDER — SODIUM CHLORIDE 0.9 % IV SOLN: 250.0000 mL | INTRAVENOUS | Status: DC | PRN

## 2017-10-04 MED ORDER — NICOTINE 14 MG/24HR TD PT24
14.0000 mg | MEDICATED_PATCH | Freq: Every day | TRANSDERMAL | Status: DC
  Administered 2017-10-04 – 2017-10-06 (×3): 14 mg via TRANSDERMAL
  Filled 2017-10-04 (×3): qty 1

## 2017-10-04 MED ORDER — ENSURE ENLIVE PO LIQD
237.0000 mL | Freq: Three times a day (TID) | ORAL | Status: DC
  Administered 2017-10-04 – 2017-10-06 (×6): 237 mL via ORAL

## 2017-10-04 MED ORDER — INSULIN ASPART 100 UNIT/ML ~~LOC~~ SOLN
0.0000 [IU] | Freq: Three times a day (TID) | SUBCUTANEOUS | Status: DC
  Administered 2017-10-04: 2 [IU] via SUBCUTANEOUS
  Administered 2017-10-04: 1 [IU] via SUBCUTANEOUS
  Administered 2017-10-05 – 2017-10-06 (×2): 2 [IU] via SUBCUTANEOUS

## 2017-10-04 NOTE — ED Notes (Signed)
Admitting NP explained plan of care and admission to pt.

## 2017-10-04 NOTE — Progress Notes (Addendum)
PULMONARY / CRITICAL CARE MEDICINE   Name: Malik Lambert MRN: [id number] DOB: 04/06/1966    ADMISSION DATE:  10/03/2017 CONSULTATION DATE:  10/04/2017  REFERRING MD:  Dr. 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 Patient states he has been unable to ambulate to get to the kitchen for food/drinks during this time because he feels as if he is going to pass out, however he has taken his BP medications and drank 2 "glasses" of vodka per day and 4-5 beers with last one being 2/27. Upon arrival BP 70-80s. LA 3.02, ETOH 111. Given 4L NS and started on levophed gtt. Vancomycin and Zosyn given. PCCM asked to admit.    PAST MEDICAL HISTORY :  He  has a past medical history of Acid reflux, Alcohol abuse, Anxiety disorder, Aortic stenosis, Aortic valve defect, Ascending aortic aneurysm (HCC), Atrial flutter (HCC), Chronic constipation, Dyspnea, GERD (gastroesophageal reflux disease), Heart murmur, Heart murmur, High risk medication use, Hypertension, essential, LVH (left ventricular hypertrophy), Mixed hyperlipidemia, PAD (peripheral artery disease) ([place]), Rheumatic fever/heart disease, and Vitamin D deficiency.  PAST SURGICAL HISTORY: He  has a past surgical history that includes arotic valve replacement (12/2015); Tendon repair (1986); Appendectomy (1988); Cardiac catheterization (N/A, 05/17/2016); Ablation of dysrhythmic focus (05/17/2016); Intramedullary (im) nail intertrochanteric (Right, 01/29/2017); Colonoscopy with propofol (N/A, 02/02/2017); and Aortic valve replacement.  No Known Allergies  No current facility-administered medications on file prior to encounter.    Current Outpatient Medications on File Prior to Encounter  Medication  Sig  . ELIQUIS 5 MG TABS tablet Take 5 mg by mouth 2 (two) times daily.   Marland Kitchen FLUoxetine (PROZAC) 20 MG capsule Take 20 mg by mouth daily.  Marland Kitchen lisinopril (PRINIVIL,ZESTRIL) 20 MG tablet Take 20 mg by mouth daily.   . methocarbamol (ROBAXIN) 750 MG tablet Take 750 mg by mouth every morning.   . metoprolol (LOPRESSOR) 100 MG tablet Take 100 mg by mouth 2 (two) times daily.   . pravastatin (PRAVACHOL) 20 MG tablet Take 20 mg by mouth daily.   . ferrous sulfate 325 (65 FE) MG tablet Take 1 tablet (325 mg total) by mouth 2 (two) times daily with a meal. (Patient not taking: Reported on 10/03/2017)    FAMILY HISTORY:  His indicated that the status of his mother is unknown. He indicated that the status of his father is unknown.   SOCIAL HISTORY: He  reports that he has been smoking cigarettes.  He has a 30.00 pack-year smoking history. he has never used smokeless tobacco. He reports that he drinks alcohol. He reports that he does not use drugs.  REVIEW OF SYSTEMS:   All negative; except for those that are bolded, which indicate positives.  Constitutional: weight loss, weight gain, night sweats, fevers, chills, fatigue, weakness.  HEENT: headaches, sore throat, sneezing, nasal congestion, post nasal drip, difficulty swallowing, tooth/dental problems, visual complaints, visual changes, ear aches. Neuro: difficulty with speech, weakness, numbness, ataxia. dizziness CV:  chest pain, orthopnea, PND, swelling in lower extremities, dizziness, palpitations, syncope.  Resp: cough, hemoptysis, dyspnea, wheezing. GI: heartburn, indigestion, abdominal pain, nausea, vomiting, diarrhea, constipation, change in bowel habits, loss of appetite, hematemesis, melena, hematochezia.  GU: dysuria, change in color of urine, urgency or  frequency, flank pain, hematuria. MSK: joint pain or swelling, decreased range of motion. Psych: change in mood or affect, depression, anxiety, suicidal ideations, homicidal  ideations. Skin: rash, itching, bruising.   SUBJECTIVE: remains dizzy this morning. Denies symptoms of withdrawal. Asking for food, finally got appetite back.  VITAL SIGNS: BP (!) 86/68   Pulse 71   Temp 97.8 F (36.6 C) (Oral)   Resp 18   Ht 5\' 6"  (1.676 m)   Wt 132 lb 0.9 oz (59.9 kg)   SpO2 100%   BMI 21.31 kg/m   HEMODYNAMICS:    VENTILATOR SETTINGS:    INTAKE / OUTPUT: I/O last 3 completed shifts: In: 4961.1 [I.V.:561.1; IV Piggyback:4400] Out: 825 [Urine:825]  PHYSICAL EXAMINATION: General:  Adult male, laying in bed in no distress  Neuro:  Alert, oriented, follows commands, no focal deficits HEENT:  MMM. PERRL. EOMI Cardiovascular:  RRR Lungs:  Clear breath sounds, no wheeze/crackles  Abdomen:  Non-distended, active bowel sounds  Musculoskeletal:  -edema. SCDs in place. Skin:  Warm, dry, intact. Various ecchymoses on arms bilaterally   LABS:  BMET Recent Labs  Lab 10/03/17 2018 10/03/17 2025  NA 128* 129*  K 3.3* 3.3*  CL 95* 97*  CO2 14*  --   BUN 34* 29*  CREATININE 1.72* 1.60*  GLUCOSE 89 81    Electrolytes Recent Labs  Lab 10/03/17 2018 10/03/17 2357  CALCIUM 8.2*  --   MG  --  1.4*  PHOS  --  2.8    CBC Recent Labs  Lab 10/03/17 2018 10/03/17 2025  WBC 6.7  --   HGB 9.9* 10.2*  HCT 26.8* 30.0*  PLT 115*  --     Coag's No results for input(s): APTT, INR in the last 168 hours.  Sepsis Markers Recent Labs  Lab 10/03/17 2026 10/03/17 2202 10/03/17 2357 10/04/17 0230  LATICACIDVEN 3.02* 1.68  --  1.0  PROCALCITON  --   --  0.11  --     ABG Recent Labs  Lab 10/04/17 0246  PHART 7.325*  PCO2ART 23.3*  PO2ART 97.0    Liver Enzymes Recent Labs  Lab 10/03/17 2018 10/04/17 0005  AST 99* 140*  ALT 68* 66*  ALKPHOS 78 75  BILITOT 1.1 1.1  ALBUMIN 3.0* 2.4*    Cardiac Enzymes No results for input(s): TROPONINI, PROBNP in the last 168 hours.  Glucose Recent Labs  Lab 10/04/17 0120 10/04/17 0349  GLUCAP  90 121*    Imaging Dg Chest Port 1 View  Result Date: 10/03/2017 CLINICAL DATA:  Cough for 3 days EXAM: PORTABLE CHEST 1 VIEW COMPARISON:  None. FINDINGS: No focal pulmonary opacity or pleural effusion. Post sternotomy changes and valve prosthesis. Aortic atherosclerosis. Normal heart size. No pneumothorax. Old left fifth rib fracture IMPRESSION: No active disease. Electronically Signed   By: Donavan Foil M.D.   On: 10/03/2017 20:14     STUDIES:  CXR 2/27 > No focal pulmonary opacity or pleural effusion. Post sternotomy changes and valve prosthesis. Aortic atherosclerosis. Normal heart size. No pneumothorax. Old left fifth rib fracture  CULTURES: Blood 2/27 >> U/A 2/28 >>  RVP 2/28 > negative  ANTIBIOTICS: Zosyn 2/28  Vancomycin 2/28   SIGNIFICANT EVENTS: 2/27 > Presents to ED   LINES/TUBES: PIV   DISCUSSION: 52 year old male with h/o ETOH abuse presents to ED on 2/27 with fatigue, weakness, cough, and dyspnea. Patient states he has not been eating/drinking for the last 5-6 days however has continued to take  his BP medications and drink EOTH. Upon arrival Systolic 26-33, given 4L NS and started on levophed gtt.    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, HTN  P:  Cardiac Monitoring  Wean Levophed to maintain systolic >35  Hold Lisinopril, Lopressor, Lasix in setting of hypotension   RENAL A:   Anion Gap Metabolic Acidosis with Lactic Acidosis > Improving s/p 4L NS  LA 3.02 > 1.68 >  Acute Kidney Injury  Hypokalemia  Hypomagnesemia  Hyponatremia  P:   Trend BMP Replace electrolytes as indicated > K and Mg replacements ordered  LR @ 100 ml/hr   Monitor UOP  GASTROINTESTINAL A:   Transaminitis (patient denies h/o cirrhosis)  -AST/ALT 140/66 Previous 46/25  GERD  P:   NPO PPI Trend LFT    HEMATOLOGIC A:   Normocytic Anemia  P:  Trend CBC  Maintain Hbg >7  INFECTIOUS A:   Lactic Acidosis presumed in setting of hypovolemia  CXR clear, afebrile, no WBC  P:   Trend WBC and Fever Curve Trend PCT and LA  Follow Culture Data  Monitor off Antibiotics    ENDOCRINE A:   No issues    P:   Trend Glucose   NEUROLOGIC A:   ETOH Abuse - Last admission noted withdrawal symptoms, patient states he has never had a seizure or noticed withdrawal symptoms in the past  Drinks 4-5 beers and 2 glasses of vodka per day Acetaminophen <45 Salicylate <7 ETOH 625 UDS neg P:   CIWA Protocol  Folic acid/Thimaine/Multiviatimin   FAMILY  - Updates: Patient updated on plan of care, no family present  - Inter-disciplinary family meet or Palliative Care meeting due by: 10/11/2017  Lucila Maine, DO PGY-2, [facility] 10/04/2017 8:08 AM  Attending Note:  52 year old male with strong ETOH abuse who was not eating but continued to drink alcohol and take his BP medicine presenting hypotensive and on norepi.  On exam, patient is alert and interactive but tremulous with clear lungs.  I reviewed CXR myself, no acute disease noted.  Procalcitonin 0.11, no fever or WBC, no need for abx.  Will place CIWA protocol with IV ativan.  D/C phenobarbital.  Transfer to SDU once off pressors.  The patient is critically ill with multiple organ systems failure and requires high complexity decision making for assessment and support, frequent evaluation and titration of therapies, application of advanced monitoring technologies and extensive interpretation of multiple databases.   Critical Care Time devoted to patient care services described in this note is  35  Minutes. This time reflects time of care of this signee Dr Jennet Maduro. This  critical care time does not reflect procedure time, or teaching time or supervisory time of PA/NP/Med student/Med Resident etc but could involve care discussion time.  Rush Farmer, M.D. [facility] Pulmonary/Critical Care Medicine. Pager: [phone]. After hours pager: [phone].

## 2017-10-04 NOTE — Progress Notes (Addendum)
Initial Nutrition Assessment  DOCUMENTATION CODES:   Non-severe (moderate) malnutrition in context of chronic illness  INTERVENTION:    Ensure Enlive po TID with meals, each supplement provides 350 kcal and 20 grams of protein  NUTRITION DIAGNOSIS:   Moderate Malnutrition related to chronic illness as evidenced by energy intake < 75% for > or equal to 1 month, mild muscle depletion, moderate muscle depletion, percent weight loss(12% weight loss within 6 months).  GOAL:   Patient will meet greater than or equal to 90% of their needs  MONITOR:   PO intake, Supplement acceptance  REASON FOR ASSESSMENT:   Malnutrition Screening Tool    ASSESSMENT:   52 yo male with PMH of anxiety, vitamin D deficiency, HLD, HTN, Rheumatic fever/heart disease, chronic constipation, PAD, GERD, and alcohol abuse who was admitted on 2/27 with hypovolemia.  Patient reports that he has been unable to stand for the past week and therefore has not been able to walk to the kitchen to prepare food. Prior to this, he tried to eat a lunch and dinner every day, but for the past 1-2 months he has been eating poorly. From discussion of usual intake with patient, intake has been meeting < 75% of estimated energy needs for >/= 1 month. Patient reports he weighed 138 lbs one month ago; 150 lbs 6 months ago; currently 132 lbs. 12% weight loss within six months is significant.  Labs and medications reviewed. CBG's: 90-121-204 Sodium 129 (L), phosphorus 2.2 (L), potassium 3.9 (WNL), magnesium 2.3 (WNL) Receiving MVI, folic acid, thiamine, and KCl for repletion.  NUTRITION - FOCUSED PHYSICAL EXAM:    Most Recent Value  Orbital Region  No depletion  Upper Arm Region  No depletion  Thoracic and Lumbar Region  No depletion  Buccal Region  No depletion  Temple Region  No depletion  Clavicle Bone Region  Mild depletion  Clavicle and Acromion Bone Region  Mild depletion  Scapular Bone Region  Mild depletion  Dorsal  Hand  Mild depletion  Patellar Region  Moderate depletion  Anterior Thigh Region  Moderate depletion  Posterior Calf Region  Moderate depletion  Edema (RD Assessment)  None  Hair  Reviewed  Eyes  Reviewed  Mouth  Reviewed  Skin  Reviewed  Nails  Reviewed       Diet Order:  Diet Heart Room service appropriate? Yes; Fluid consistency: Thin  EDUCATION NEEDS:   No education needs have been identified at this time  Skin:  Skin Assessment: Reviewed RN Assessment  Last BM:  PTA  Height:   Ht Readings from Last 1 Encounters:  10/03/17 5\' 6"  (1.676 m)    Weight:   Wt Readings from Last 1 Encounters:  10/04/17 132 lb 0.9 oz (59.9 kg)    Ideal Body Weight:  64.5 kg  BMI:  Body mass index is 21.31 kg/m.  Estimated Nutritional Needs:   Kcal:  1850-2050  Protein:  85-100 gm  Fluid:  1.8-2 L    Molli Barrows, RD, LDN, [place] Pager [phone] After Hours Pager [phone]

## 2017-10-04 NOTE — H&P (Signed)
PULMONARY / CRITICAL CARE MEDICINE   Name: Malik Lambert MRN: [id number] DOB: 1965-10-06    ADMISSION DATE:  10/03/2017 CONSULTATION DATE:  10/04/2017  REFERRING MD:  Dr. 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 Patient states he has been unable to ambulate to get to the kitchen for food/drinks during this time because he feels as if he is going to pass out, however he has taken his BP medications and drank 2 "glasses" of vodka per day and 4-5 beers with last one being 2/27. Upon arrival BP 70-80s. LA 3.02, ETOH 111. Given 4L NS and started on levophed gtt. Vancomycin and Zosyn given. PCCM asked to admit.    PAST MEDICAL HISTORY :  He  has a past medical history of Acid reflux, Alcohol abuse, Anxiety disorder, Aortic stenosis, Aortic valve defect, Ascending aortic aneurysm (HCC), Atrial flutter (HCC), Chronic constipation, Dyspnea, GERD (gastroesophageal reflux disease), Heart murmur, Heart murmur, High risk medication use, Hypertension, essential, LVH (left ventricular hypertrophy), Mixed hyperlipidemia, PAD (peripheral artery disease) ([place]), Rheumatic fever/heart disease, and Vitamin D deficiency.  PAST SURGICAL HISTORY: He  has a past surgical history that includes arotic valve replacement (12/2015); Tendon repair (1986); Appendectomy (1988); Cardiac catheterization (N/A, 05/17/2016); Ablation of dysrhythmic focus (05/17/2016); Intramedullary (im) nail intertrochanteric (Right, 01/29/2017); Colonoscopy with propofol (N/A, 02/02/2017); and Aortic valve replacement.  No Known Allergies  No current facility-administered medications on file prior to encounter.    Current Outpatient Medications on File Prior to Encounter  Medication  Sig  . ELIQUIS 5 MG TABS tablet Take 5 mg by mouth 2 (two) times daily.   Marland Kitchen FLUoxetine (PROZAC) 20 MG capsule Take 20 mg by mouth daily.  Marland Kitchen lisinopril (PRINIVIL,ZESTRIL) 20 MG tablet Take 20 mg by mouth daily.   . methocarbamol (ROBAXIN) 750 MG tablet Take 750 mg by mouth every morning.   . metoprolol (LOPRESSOR) 100 MG tablet Take 100 mg by mouth 2 (two) times daily.   . pravastatin (PRAVACHOL) 20 MG tablet Take 20 mg by mouth daily.   . ferrous sulfate 325 (65 FE) MG tablet Take 1 tablet (325 mg total) by mouth 2 (two) times daily with a meal. (Patient not taking: Reported on 10/03/2017)    FAMILY HISTORY:  His indicated that the status of his mother is unknown. He indicated that the status of his father is unknown.   SOCIAL HISTORY: He  reports that he has been smoking cigarettes.  He has a 30.00 pack-year smoking history. he has never used smokeless tobacco. He reports that he drinks alcohol. He reports that he does not use drugs.  REVIEW OF SYSTEMS:   All negative; except for those that are bolded, which indicate positives.  Constitutional: weight loss, weight gain, night sweats, fevers, chills, fatigue, weakness.  HEENT: headaches, sore throat, sneezing, nasal congestion, post nasal drip, difficulty swallowing, tooth/dental problems, visual complaints, visual changes, ear aches. Neuro: difficulty with speech, weakness, numbness, ataxia. CV:  chest pain, orthopnea, PND, swelling in lower extremities, dizziness, palpitations, syncope.  Resp: cough, hemoptysis, dyspnea, wheezing. GI: heartburn, indigestion, abdominal pain, nausea, vomiting, diarrhea, constipation, change in bowel habits, loss of appetite, hematemesis, melena, hematochezia.  GU: dysuria, change in color of urine, urgency or frequency,  flank pain, hematuria. MSK: joint pain or swelling, decreased range of motion. Psych: change in mood or affect, depression, anxiety, suicidal ideations, homicidal ideations. Skin: rash,  itching, bruising.   SUBJECTIVE:   VITAL SIGNS: BP 110/81   Pulse 75   Temp 97.9 F (36.6 C) (Temporal)   Resp 18   Ht 5\' 6"  (1.676 m)   Wt 63.5 kg (140 lb)   SpO2 100%   BMI 22.60 kg/m   HEMODYNAMICS:    VENTILATOR SETTINGS:    INTAKE / OUTPUT: No intake/output data recorded.  PHYSICAL EXAMINATION: General:  Adult male, no distress  Neuro:  Alert, oriented, follows commands  HEENT:  Dry MM  Cardiovascular:  RRR Lungs:  Clear breath sounds, no wheeze/crackles  Abdomen:  Non-distended, active bowel sounds  Musculoskeletal:  -edema  Skin:  Warm, dry, intact   LABS:  BMET Recent Labs  Lab 10/03/17 2018 10/03/17 2025  NA 128* 129*  K 3.3* 3.3*  CL 95* 97*  CO2 14*  --   BUN 34* 29*  CREATININE 1.72* 1.60*  GLUCOSE 89 81    Electrolytes Recent Labs  Lab 10/03/17 2018 10/03/17 2357  CALCIUM 8.2*  --   MG  --  1.4*  PHOS  --  2.8    CBC Recent Labs  Lab 10/03/17 2018 10/03/17 2025  WBC 6.7  --   HGB 9.9* 10.2*  HCT 26.8* 30.0*  PLT 115*  --     Coag's No results for input(s): APTT, INR in the last 168 hours.  Sepsis Markers Recent Labs  Lab 10/03/17 2026 10/03/17 2202  LATICACIDVEN 3.02* 1.68    ABG No results for input(s): PHART, PCO2ART, PO2ART in the last 168 hours.  Liver Enzymes Recent Labs  Lab 10/03/17 2018 10/04/17 0005  AST 99* 140*  ALT 68* 66*  ALKPHOS 78 75  BILITOT 1.1 1.1  ALBUMIN 3.0* 2.4*    Cardiac Enzymes No results for input(s): TROPONINI, PROBNP in the last 168 hours.  Glucose No results for input(s): GLUCAP in the last 168 hours.  Imaging Dg Chest Port 1 View  Result Date: 10/03/2017 CLINICAL DATA:  Cough for 3 days EXAM: PORTABLE CHEST 1 VIEW COMPARISON:  None. FINDINGS: No focal pulmonary opacity or pleural effusion. Post sternotomy changes and valve prosthesis. Aortic atherosclerosis. Normal heart size. No pneumothorax. Old left fifth rib fracture IMPRESSION: No active disease. Electronically  Signed   By: Donavan Foil M.D.   On: 10/03/2017 20:14     STUDIES:  CXR 2/27 > No focal pulmonary opacity or pleural effusion. Post sternotomy changes and valve prosthesis. Aortic atherosclerosis. Normal heart size. No pneumothorax. Old left fifth rib fracture  CULTURES: Blood 2/27 >> U/A 2/28 >>   ANTIBIOTICS: Zosyn 2/28  Vancomycin 2/28   SIGNIFICANT EVENTS: 2/27 > Presents to ED   LINES/TUBES: PIV   DISCUSSION: 52 year old male with h/o ETOH abuse presents to ED on 2/27 with fatigue, weakness, cough, and dyspnea. Patient states he has not been eating/drinking for the last 5-6 days however has continued to take his BP medications and drink EOTH. Upon arrival Systolic 59-56, given 4L NS and started on levophed gtt.    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 2017, HTN  P:  Cardiac Monitoring  Wean Levophed to maintain systolic >78  Hold Lisinopril, Lopressor, Lasix in setting of hypotension   RENAL A:   Anion Gap Metabolic Acidosis with Lactic Acidosis > Improving s/p 4L NS  LA 3.02 > 1.68 >  Acute Kidney Injury  Hypokalemia  Hypomagnesemia  Hyponatremia  P:   Trend BMP Replace electrolytes as indicated > K and Mg replacements ordered  Bicarb push given in ED  Given 1L LR now, LR @ 100 ml/hr   GASTROINTESTINAL A:   Transaminitis (patient denies h/o cirrhosis)  -AST/ALT 140/66 Previous 46/25  GERD  P:   NPO PPI Trend LFT    HEMATOLOGIC A:   Normocytic Anemia  P:  Trend CBC  Maintain Hbg >7  INFECTIOUS A:   Lactic Acidosis presumed in setting of hypovolemia  CXR clear, afebrile, no WBC  P:   Trend WBC and Fever  Curve Trend PCT and LA  Follow Culture Data  Monitor off Antibiotics    ENDOCRINE A:   No issues    P:   Trend Glucose   NEUROLOGIC A:   ETOH Abuse - Last admission noted withdrawal symptoms, patient states he has never had a seizure or noticed withdrawal symptoms in the past  Drinks 4-5 beers and 2 glasses of vodka per day Acetaminophen <67 Salicylate <7 ETOH 544  P:   CIWA Protocol  Folic acid/Thimaine/Multiviatimin  UDS pending   FAMILY  - Updates: Patient updated on plan of care, no family present   - Inter-disciplinary family meet or Palliative Care meeting due by: 10/11/2017   CC Time: 37 minutes   Hayden Pedro, AGACNP-BC [place] [facility]  Pgr: [phone]  PCCM Pgr: [phone]

## 2017-10-04 NOTE — Progress Notes (Signed)
Dr Nelda Marseille notified of BP 94'I systolic. Nursing to keep MAP greater than 65. Pt currently A&Ox's 4.

## 2017-10-04 NOTE — Evaluation (Signed)
Physical Therapy Evaluation Patient Details Name: Malik Lambert MRN: [id number] DOB: Dec 09, 1965 Today's Date: 10/04/2017   History of Present Illness  52 y.o. male admitted on 10/03/17 for hypotension.  Pt dx with hypovolemic shock, anion gap metabolic acidosis with lactic acidosis, acute kidney injury, transaminitis, normocytic anemia, and ETOH abuse (monitoring for withdrawl symptoms).    Pt with significant PMH of PAD, LVH, HTN, A-flutter, ascending aortic aneurysm, aortic stenosis, and R IM Nail.    Clinical Impression  Pt is very lightheaded with low BPs in standing.  He stood without much physical assist.  As his BP stabilizes, I believe he will return to his PLOF of independent in the home and walking with a cane in the community.   PT to follow acutely for deficits listed below.      10/04/17 1700  Vital Signs  Patient Position (if appropriate) Orthostatic Vitals  Orthostatic Lying   BP- Lying (!) 89/68 (MAP 75)  Orthostatic Sitting  BP- Sitting 93/65 (MAP 75, at 5 mins: 89/69 MAP 75)  Orthostatic Standing at 0 minutes  BP- Standing at 0 minutes (!) 76/57 (MAP 64)  Pulse- Standing at 0 minutes 120  Orthostatic Standing at 3 minutes  BP- Standing at 3 minutes (unable to get as pt is symptomatic)  Oxygen Therapy  SpO2 100 %  O2 Device Room Air     Follow Up Recommendations No PT follow up;Other (comment)(depending on progress)    Equipment Recommendations  None recommended by PT    Recommendations for Other Services   NA    Precautions / Restrictions Precautions Precautions: Fall Precaution Comments: h/o falls, but most recent sound like they are related to the syncope.  His hip fx was last June      Mobility  Bed Mobility Overal bed mobility: Modified Independent             General bed mobility comments: slow, uses bed rail for leverage.   Transfers Overall transfer level: Needs assistance Equipment used: Straight cane Transfers: Sit to/from Stand Sit  to Stand: Min assist         General transfer comment: Min assist to steady trunk during transitions.  Lightheaded in standing.  Only able to get one pressure and then needed to sit back down.   Ambulation/Gait Ambulation/Gait assistance: Min assist Ambulation Distance (Feet): 3 Feet Assistive device: Straight cane Gait Pattern/deviations: Step-to pattern     General Gait Details: side steps towards HOB to return to supine.  BP too low and pt too symptomatic to continue today.           Balance Overall balance assessment: Needs assistance Sitting-balance support: Feet supported;Bilateral upper extremity supported Sitting balance-Leahy Scale: Fair     Standing balance support: Single extremity supported Standing balance-Leahy Scale: Poor                               Pertinent Vitals/Pain Pain Assessment: No/denies pain    Home Living Family/patient expects to be discharged to:: Private residence Living Arrangements: Alone   Type of Home: House Home Access: Stairs to enter Entrance Stairs-Rails: Right Entrance Stairs-Number of Steps: 3 Home Layout: Two level;Bed/bath upstairs;1/2 bath on main level Home Equipment: Cane - single point;Cane - quad Additional Comments: He has a girlfriend who is flying in from where she lives in [place].  He says they see each other ~5 times a year.     Prior Function Level  of Independence: Independent with assistive device(s)         Comments: uses cane for community ambulation, otherwise, independent with gait in th home.  Chart states that he recently lost his job.      Hand Dominance   Dominant Hand: Right    Extremity/Trunk Assessment   Upper Extremity Assessment Upper Extremity Assessment: Generalized weakness    Lower Extremity Assessment Lower Extremity Assessment: RLE deficits/detail RLE Deficits / Details: right leg with recent history of repair after hip fracture in June of 2018.      Cervical /  Trunk Assessment Cervical / Trunk Assessment: Normal  Communication   Communication: No difficulties  Cognition Arousal/Alertness: Awake/alert Behavior During Therapy: Flat affect Overall Cognitive Status: Within Functional Limits for tasks assessed                                 General Comments: Not specifically tested, but conversation normal             Assessment/Plan    PT Assessment Patient needs continued PT services  PT Problem List Decreased strength;Decreased activity tolerance;Decreased balance;Decreased mobility;Decreased knowledge of use of DME;Cardiopulmonary status limiting activity       PT Treatment Interventions DME instruction;Gait training;Stair training;Functional mobility training;Therapeutic activities;Therapeutic exercise;Balance training;Patient/family education    PT Goals (Current goals can be found in the Care Plan section)  Acute Rehab PT Goals Patient Stated Goal: to get back to normal PT Goal Formulation: With patient Time For Goal Achievement: 10/18/17 Potential to Achieve Goals: Good    Frequency Min 3X/week           AM-PAC PT "6 Clicks" Daily Activity  Outcome Measure Difficulty turning over in bed (including adjusting bedclothes, sheets and blankets)?: None Difficulty moving from lying on back to sitting on the side of the bed? : None Difficulty sitting down on and standing up from a chair with arms (e.g., wheelchair, bedside commode, etc,.)?: Unable Help needed moving to and from a bed to chair (including a wheelchair)?: A Little Help needed walking in hospital room?: A Little Help needed climbing 3-5 steps with a railing? : A Lot 6 Click Score: 17    End of Session   Activity Tolerance: Patient limited by fatigue Patient left: in bed;with call bell/phone within reach;with bed alarm set Nurse Communication: Mobility status PT Visit Diagnosis: Muscle weakness (generalized) (M62.81);Difficulty in walking, not  elsewhere classified (R26.2)    Time: 1025-8527 PT Time Calculation (min) (ACUTE ONLY): 34 min   Charges:           Wells Guiles B. Claxton Levitz, PT, DPT [phone]   PT Evaluation $PT Eval Moderate Complexity: 1 Mod PT Treatments $Therapeutic Activity: 8-22 mins   10/04/2017, 5:58 PM

## 2017-10-05 DIAGNOSIS — G9340 Encephalopathy, unspecified: Secondary | ICD-10-CM

## 2017-10-05 LAB — GLUCOSE, CAPILLARY
GLUCOSE-CAPILLARY: 158 mg/dL — AB (ref 65–99)
GLUCOSE-CAPILLARY: 173 mg/dL — AB (ref 65–99)
GLUCOSE-CAPILLARY: 106 mg/dL — AB (ref 65–99)
Glucose-Capillary: 96 mg/dL (ref 65–99)

## 2017-10-05 LAB — PROCALCITONIN: Procalcitonin: <0.1 ng/mL

## 2017-10-05 MED ORDER — PANTOPRAZOLE SODIUM 40 MG PO TBEC
40.0000 mg | DELAYED_RELEASE_TABLET | Freq: Every day | ORAL | Status: DC
  Administered 2017-10-06: 40 mg via ORAL
  Filled 2017-10-05: qty 1

## 2017-10-05 MED ORDER — POLYETHYLENE GLYCOL 3350 17 G PO PACK
17.0000 g | PACK | Freq: Two times a day (BID) | ORAL | Status: DC
  Administered 2017-10-05 – 2017-10-06 (×2): 17 g via ORAL
  Filled 2017-10-05 (×4): qty 1

## 2017-10-05 MED ORDER — VITAMIN B-1 100 MG PO TABS
100.0000 mg | ORAL_TABLET | Freq: Every day | ORAL | Status: DC
  Administered 2017-10-06: 100 mg via ORAL
  Filled 2017-10-05: qty 1

## 2017-10-05 MED ORDER — FOLIC ACID 1 MG PO TABS
1.0000 mg | ORAL_TABLET | Freq: Every day | ORAL | Status: DC
  Administered 2017-10-06: 1 mg via ORAL
  Filled 2017-10-05: qty 1

## 2017-10-05 NOTE — Progress Notes (Signed)
Physical Therapy Treatment Patient Details Name: Malik Lambert MRN: [id number] DOB: September 02, 1965 Today's Date: 10/05/2017    History of Present Illness 52 y.o. male admitted on 10/03/17 for hypotension.  Pt dx with hypovolemic shock, anion gap metabolic acidosis with lactic acidosis, acute kidney injury, transaminitis, normocytic anemia, and ETOH abuse (monitoring for withdrawl symptoms).    Pt with significant PMH of PAD, LVH, HTN, A-flutter, ascending aortic aneurysm, aortic stenosis, and R IM Nail.      PT Comments    Pt admitted with above diagnosis. Pt currently with functional limitations due to balance and endurance deficits. Pt was able to ambulate a good distance on unit with VSS with rW use.  Pt progressing well.  Pt will benefit from skilled PT to increase their independence and safety with mobility to allow discharge to the venue listed below.     Follow Up Recommendations  No PT follow up;Other (comment)(depending on progress)     Equipment Recommendations  None recommended by PT    Recommendations for Other Services       Precautions / Restrictions Precautions Precautions: Fall Restrictions Weight Bearing Restrictions: No    Mobility  Bed Mobility               General bed mobility comments: in chair on arrival  Transfers Overall transfer level: Needs assistance Equipment used: Rolling walker (2 wheeled) Transfers: Sit to/from Stand Sit to Stand: Min guard         General transfer comment: no steadying assist needed today.   Ambulation/Gait Ambulation/Gait assistance: Min guard;Supervision Ambulation Distance (Feet): 560 Feet Assistive device: Rolling walker (2 wheeled) Gait Pattern/deviations: Step-through pattern;Decreased stride length   Gait velocity interpretation: at or above normal speed for age/gender General Gait Details: Pt was able to ambulate on unit with RW with good stability overall.  VSS with monitor showing artifact at times.  HR  91-121 bpm, BP 93/76 initially and 76/67 standing.  Pt asymptomatic.  BP at end of treatment 97/74.     Stairs            Wheelchair Mobility    Modified Rankin (Stroke Patients Only)       Balance Overall balance assessment: Needs assistance Sitting-balance support: Feet supported;Bilateral upper extremity supported Sitting balance-Leahy Scale: Fair     Standing balance support: During functional activity;No upper extremity supported Standing balance-Leahy Scale: Fair Standing balance comment: can stand statically without UE support                            Cognition Arousal/Alertness: Awake/alert Behavior During Therapy: Flat affect Overall Cognitive Status: Within Functional Limits for tasks assessed                                 General Comments: Not specifically tested, but conversation normal      Exercises General Exercises - Lower Extremity Ankle Circles/Pumps: AROM;Both;10 reps;Supine Long Arc Quad: AROM;Both;10 reps;Seated    General Comments        Pertinent Vitals/Pain Pain Assessment: No/denies pain    Home Living                      Prior Function            PT Goals (current goals can now be found in the care plan section) Acute Rehab PT Goals Patient Stated Goal: to  get back to normal Progress towards PT goals: Progressing toward goals    Frequency    Min 3X/week      PT Plan Current plan remains appropriate    Co-evaluation              AM-PAC PT "6 Clicks" Daily Activity  Outcome Measure  Difficulty turning over in bed (including adjusting bedclothes, sheets and blankets)?: None Difficulty moving from lying on back to sitting on the side of the bed? : None Difficulty sitting down on and standing up from a chair with arms (e.g., wheelchair, bedside commode, etc,.)?: None Help needed moving to and from a bed to chair (including a wheelchair)?: A Little Help needed walking in  hospital room?: A Little Help needed climbing 3-5 steps with a railing? : A Lot 6 Click Score: 20    End of Session Equipment Utilized During Treatment: Gait belt Activity Tolerance: Patient limited by fatigue Patient left: with call bell/phone within reach;in chair;with family/visitor present Nurse Communication: Mobility status PT Visit Diagnosis: Muscle weakness (generalized) (M62.81);Difficulty in walking, not elsewhere classified (R26.2)     Time: 1414-1430 PT Time Calculation (min) (ACUTE ONLY): 16 min  Charges:  $Gait Training: 8-22 mins                    G Codes:       Malik Lambert,PT Acute Rehabilitation [phone] (pager)    Malik Lambert 10/05/2017, 3:05 PM

## 2017-10-05 NOTE — Discharge Summary (Addendum)
Physician Discharge Summary  Patient ID: Malik Lambert MRN: [id number] DOB/AGE: [birth year] 53 y.o.  Admit date: 10/03/2017 Discharge date: 10/06/2017    Discharge Diagnoses:   Hypovolemic Shock with anion gap metabolic acidosis  Acute Kidney Injury  A.Flutter s/p Ablation  Rheumatic Heart Disease s/p bovine AVR HTN Mixed Hyperlipidemia  ETOH Abuse  Tobacco Dependence  Hypophosphatemia  Hypomagnesemia                                                  DISCHARGE PLAN BY DIAGNOSIS     Hypovolemic Shock with anion gap metabolic acidosis  Discharge Plan: -Resolved   Acute Kidney Injury  Discharge Plan: -Resolved   Hyponatremia  -chronic.  Discharge plan No diuretics F/u out pt   Fluid and electrolyte imbalance: hypophosphatemia and hypomagnesemia  Discharge plan F/u pout-pt   A.Flutter s/p Ablation 05/2016 Discharge Plan: -Continue Eliquis   Rheumatic Heart Disease s/p bovine AVR Discharge Plan: -Continue to Follow Dr. Jerelene Redden of [place]   HTN Discharge Plan: Hold diuretic but resume all other home meds F/u out-pt   Mixed Hyperlipidemia  Discharge Plan: -Continue Pravachol   ETOH Abuse  Discharge Plan: -Cessation Counseling  -Continue Folic acid/Thiamine/Multivitamin   Tobacco Dependence  Discharge Plan: -Cessation Counseling                   DISCHARGE SUMMARY   52 year old male presents to ED on 2/27 with reported weakness and fatigue for 5-6 days with cough/dyspnea for the 3-4 days. Patient states he had been unable to ambulate to get to the kitchen for food/drink during this time because he felt as if he is going to pass out, however he continued to take his BP medications and drink 2 "glasses" of vodka per day and 4-5 beers with last one being 2/27. Upon arrival BP 70-80s. LA 3.02, ETOH 111. Given 4L NS and started on levophed gtt. Vancomycin and Zosyn given in ED. AST/ALT 140/66. Creatine 1.72. Throughout stay patient was treated with fluids and  temporary pressor support. Placed on CIWA protocol with PRN ativan. On 2/28 required Precedex for agitation. Evaluated by PT/OT. Deemed medically stable for discharge with follow-up appointment.          SIGNIFICANT DIAGNOSTIC STUDIES CXR 2/27 > No focal pulmonary opacity or pleural effusion. Post sternotomy changes and valve prosthesis. Aortic atherosclerosis. Normal heart size. No pneumothorax. Old left fifth rib fracture  MICRO DATA  Blood 2/27 > No Growth  U/A 2/28 > Negative   RVP 2/28 > negative  ANTIBIOTICS Zosyn 2/28  Vancomycin 2/28   Discharge Exam:  General: 52 year old white male currently in no acute distress Neuro: Awake alert no focal deficits CV: Regular rate and rhythm PULM: To auscultation GI: Soft nontender Extremities: No edema  Vitals:   10/06/17 0500 10/06/17 0600 10/06/17 0700 10/06/17 0802  BP:      Pulse: 83 75 78   Resp: 14 13 (!) 23   Temp:    98.2 F (36.8 C)  TempSrc:    Oral  SpO2:  100%    Weight: 141 lb 5 oz (64.1 kg)     Height:         Discharge Labs  BMET Recent Labs  Lab 10/03/17 2018 10/03/17 2025 10/03/17 2357 10/04/17 0825 10/06/17 0227  NA 128* 129*  --  129* 127*  K 3.3* 3.3*  --  3.9 4.1  CL 95* 97*  --  100* 92*  CO2 14*  --   --  19* 27  GLUCOSE 89 81  --  156* 115*  BUN 34* 29*  --  22* 17  CREATININE 1.72* 1.60*  --  1.05 0.76  CALCIUM 8.2*  --   --  7.3* 8.4*  MG  --   --  1.4* 2.3 1.2*  PHOS  --   --  2.8 2.2* <1.0*    CBC Recent Labs  Lab 10/03/17 2018 10/03/17 2025 10/04/17 0825 10/06/17 0227  HGB 9.9* 10.2* 8.4* 8.0*  HCT 26.8* 30.0* 23.0* 22.0*  WBC 6.7  --  4.8 5.6  PLT 115*  --  97* 99*    Anti-Coagulation No results for input(s): INR in the last 168 hours.  Discharge Instructions    Increase activity slowly   Complete by:  As directed           Allergies as of 10/06/2017   No Known Allergies     Medication List    TAKE these medications   ELIQUIS 5 MG Tabs tablet Generic  drug:  apixaban Take 5 mg by mouth 2 (two) times daily.   ferrous sulfate 325 (65 FE) MG tablet Take 1 tablet (325 mg total) by mouth 2 (two) times daily with a meal.   FLUoxetine 20 MG capsule Commonly known as:  PROZAC Take 20 mg by mouth daily.   lisinopril 20 MG tablet Commonly known as:  PRINIVIL,ZESTRIL Take 20 mg by mouth daily.   methocarbamol 750 MG tablet Commonly known as:  ROBAXIN Take 750 mg by mouth every morning.   metoprolol tartrate 100 MG tablet Commonly known as:  LOPRESSOR Take 100 mg by mouth 2 (two) times daily.   pravastatin 20 MG tablet Commonly known as:  PRAVACHOL Take 20 mg by mouth daily.       Disposition:   Discharged Condition: Malik Lambert has met maximum benefit of inpatient care and is medically stable and cleared for discharge.  Patient is pending follow up as above.      Time spent on disposition:   Signed:    Erick Colace ACNP-BC [place] Pager # [phone] OR # [phone] if no answer

## 2017-10-05 NOTE — Progress Notes (Signed)
   10/05/17 1100  Clinical Encounter Type  Visited With Patient and family together  Visit Type Initial  Referral From Nurse  Consult/Referral To Chaplain  Spiritual Encounters  Spiritual Needs Emotional  Stress Factors  Patient Stress Factors Exhausted  Family Stress Factors Exhausted    Pt. Was sitting in his bed looking at his lap top computer. Girlfriend from [place] on-site. Both patient and girlfriend were very receptive and appreciative of chaplains visit . I provided compassionate presence.  Shunsuke Granzow a Medical sales representative, [employer]

## 2017-10-05 NOTE — Plan of Care (Signed)
Patient progressing toward home.  Able to make laps in hallway withour feeling dizzy, HR up to 136 pt remains asymptomatic.  Will continue to monitor

## 2017-10-05 NOTE — Progress Notes (Signed)
PULMONARY / CRITICAL CARE MEDICINE   Name: Malik Lambert MRN: [id number] DOB: 1966/03/15    ADMISSION DATE:  10/03/2017 CONSULTATION DATE:  10/04/2017  REFERRING MD:  Dr. 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 Patient states he has been unable to ambulate to get to the kitchen for food/drinks during this time because he feels as if he is going to pass out, however he has taken his BP medications and drank 2 "glasses" of vodka per day and 4-5 beers with last one being 2/27. Upon arrival BP 70-80s. LA 3.02, ETOH 111. Given 4L NS and started on levophed gtt. Vancomycin and Zosyn given. PCCM asked to admit.    SUBJECTIVE: Became agitated overnight and required precedex and subsequently levophed  VITAL SIGNS: BP (!) 88/66   Pulse 90   Temp 98.2 F (36.8 C) (Oral)   Resp 19   Ht 5\' 6"  (1.676 m)   Wt 139 lb 15.9 oz (63.5 kg)   SpO2 98%   BMI 22.60 kg/m   HEMODYNAMICS:    VENTILATOR SETTINGS:    INTAKE / OUTPUT: I/O last 3 completed shifts: In: 6150.7 [P.O.:960; I.V.:790.7; IV Piggyback:4400] Out: 2325 [Urine:2325]  PHYSICAL EXAMINATION: General:  Adult male, laying in bed in no distress  Neuro:  Alert, oriented, follows commands, no focal deficits HEENT:  MMM. PERRL. EOMI Cardiovascular:  RRR Lungs:  Clear breath sounds, no wheeze/crackles  Abdomen:  Non-distended, active bowel sounds  Musculoskeletal:  -edema. SCDs in place. Skin:  Warm, dry, intact. Various ecchymoses on arms bilaterally   LABS:  BMET Recent Labs  Lab 10/03/17 2018 10/03/17 2025 10/04/17 0825  NA 128* 129* 129*  K 3.3* 3.3* 3.9  CL 95* 97* 100*  CO2 14*  --  19*  BUN 34* 29* 22*  CREATININE 1.72* 1.60* 1.05  GLUCOSE 89 81 156*     Electrolytes Recent Labs  Lab 10/03/17 2018 10/03/17 2357 10/04/17 0825  CALCIUM 8.2*  --  7.3*  MG  --  1.4* 2.3  PHOS  --  2.8 2.2*    CBC Recent Labs  Lab 10/03/17 2018 10/03/17 2025 10/04/17 0825  WBC 6.7  --  4.8  HGB 9.9* 10.2* 8.4*  HCT 26.8* 30.0* 23.0*  PLT 115*  --  97*    Coag's No results for input(s): APTT, INR in the last 168 hours.  Sepsis Markers Recent Labs  Lab 10/03/17 2026 10/03/17 2202 10/03/17 2357 10/04/17 0230 10/04/17 0825 10/05/17 0324  LATICACIDVEN 3.02* 1.68  --  1.0  --   --   PROCALCITON  --   --  0.11  --  <0.10 <0.10    ABG Recent Labs  Lab 10/04/17 0246  PHART 7.325*  PCO2ART 23.3*  PO2ART 97.0    Liver Enzymes Recent Labs  Lab 10/03/17 2018 10/04/17 0005 10/04/17 0825  AST 99* 140* 126*  ALT 68* 66* 78*  ALKPHOS 78 75 114  BILITOT 1.1 1.1 1.0  ALBUMIN 3.0* 2.4* 2.4*    Cardiac Enzymes No results for input(s): TROPONINI, PROBNP in the last 168 hours.  Glucose Recent Labs  Lab 10/04/17 0349 10/04/17 0722 10/04/17 1234 10/04/17 1749 10/04/17 2200 10/05/17 0730  GLUCAP 121* 204* 165* 149*  158* 96    Imaging No results found.   STUDIES:  CXR 2/27 > No focal pulmonary opacity or pleural effusion. Post sternotomy changes and valve prosthesis. Aortic atherosclerosis. Normal heart size. No pneumothorax. Old left fifth rib fracture  CULTURES: Blood 2/27 >> U/A 2/28 >>  RVP 2/28 > negative  ANTIBIOTICS: Zosyn 2/28  Vancomycin 2/28   SIGNIFICANT EVENTS: 2/27 > Presents to ED   LINES/TUBES: PIV   DISCUSSION: 52 year old male with h/o ETOH abuse presents to ED on 2/27 with fatigue, weakness, cough, and dyspnea. Patient states he has not been eating/drinking for the last 5-6 days however has continued to take his BP medications and drink EOTH. Upon arrival Systolic 84-16, given 4L NS and started on levophed gtt.    ASSESSMENT / PLAN:  PULMONARY A: Dyspnea/Cough  Tobacco Dependence  P:    D/C O2 Pulmonary Hygiene  Nicotine Patch   CARDIOVASCULAR A:  Hypotension/syncope in setting of presumed hypovolemic shock  H/O A.Flutter s/p ablation on eliquis (last dose taken 2/27 am), PAD, rheumatic heart diease s/p bovine AVR May 2017, HTN  P:  Cardiac Monitoring given hypotension overnight Attempt to get levophed off Hold Lisinopril, Lopressor, Lasix in setting of hypotension   RENAL A:   Anion Gap Metabolic Acidosis with Lactic Acidosis > Improving s/p 4L NS  LA 3.02 > 1.68 >  Acute Kidney Injury  Hypokalemia  Hypomagnesemia  Hyponatremia  P:   Trend BMP Replace electrolytes as indicated > K and Mg replacements ordered  KVO IVF Monitor UOP  GASTROINTESTINAL A:   Transaminitis (patient denies h/o cirrhosis)  -AST/ALT 140/66 Previous 46/25  GERD  P:   Regular diet PPI Trend LFT    HEMATOLOGIC A:   Normocytic Anemia  P:  Trend CBC  Maintain Hbg >7  INFECTIOUS A:   Lactic Acidosis presumed in setting of hypovolemia  CXR clear, afebrile, no WBC  P:   Trend WBC and Fever Curve Follow Culture Data  Monitor off Antibiotics    ENDOCRINE A:   No issues    P:   Trend Glucose   NEUROLOGIC A:   ETOH Abuse - Last admission noted withdrawal symptoms, patient states he has never had a seizure or noticed withdrawal symptoms in the past  Drinks 4-5 beers and 2 glasses of vodka per day Acetaminophen <60 Salicylate <7 ETOH 630 UDS neg P:   CIWA Protocol  Folic acid/Thimaine/Multiviatimin  Monitor for withdrawal  FAMILY  - Updates: Patient and sister updated bedside, will transfer to SDU if able to get off pressors and anticipate D/C in AM. - Inter-disciplinary family meet or Palliative Care meeting due by: 10/11/2017  The patient is critically ill with multiple organ systems failure and requires high complexity decision making for assessment and support, frequent evaluation and titration of therapies, application of advanced monitoring technologies and  extensive interpretation of multiple databases.   Critical Care Time devoted to patient care services described in this note is  35  Minutes. This time reflects time of care of this signee Dr Jennet Maduro. This critical care time does not reflect procedure time, or teaching time or supervisory time of PA/NP/Med student/Med Resident etc but could involve care discussion time.  Rush Farmer, M.D. [facility] Pulmonary/Critical Care Medicine. Pager: [phone]. After hours pager: [phone].

## 2017-10-06 DIAGNOSIS — R579 Shock, unspecified: Secondary | ICD-10-CM

## 2017-10-06 DIAGNOSIS — E440 Moderate protein-calorie malnutrition: Secondary | ICD-10-CM

## 2017-10-06 DIAGNOSIS — I959 Hypotension, unspecified: Secondary | ICD-10-CM

## 2017-10-06 LAB — CBC
HCT: 22 % — ABNORMAL LOW (ref 39.0–52.0)
HEMOGLOBIN: 8 g/dL — AB (ref 13.0–17.0)
MCH: 35.4 pg — ABNORMAL HIGH (ref 26.0–34.0)
MCHC: 36.4 g/dL — ABNORMAL HIGH (ref 30.0–36.0)
MCV: 97.3 fL (ref 78.0–100.0)
Platelets: 99 10*3/uL — ABNORMAL LOW (ref 150–400)
RBC: 2.26 MIL/uL — AB (ref 4.22–5.81)
RDW: 11.4 % — ABNORMAL LOW (ref 11.5–15.5)
WBC: 5.6 10*3/uL (ref 4.0–10.5)

## 2017-10-06 LAB — GLUCOSE, CAPILLARY
GLUCOSE-CAPILLARY: 154 mg/dL — AB (ref 65–99)
Glucose-Capillary: 107 mg/dL — ABNORMAL HIGH (ref 65–99)
Glucose-Capillary: 163 mg/dL — ABNORMAL HIGH (ref 65–99)

## 2017-10-06 LAB — BASIC METABOLIC PANEL
ANION GAP: 10 (ref 5–15)
Anion gap: 8 (ref 5–15)
BUN: 17 mg/dL (ref 6–20)
BUN: 15 mg/dL (ref 6–20)
CHLORIDE: 92 mmol/L — AB (ref 101–111)
CHLORIDE: 92 mmol/L — AB (ref 101–111)
CO2: 27 mmol/L (ref 22–32)
CO2: 26 mmol/L (ref 22–32)
CREATININE: 0.8 mg/dL (ref 0.61–1.24)
Calcium: 8.4 mg/dL — ABNORMAL LOW (ref 8.9–10.3)
Calcium: 8.1 mg/dL — ABNORMAL LOW (ref 8.9–10.3)
Creatinine, Ser: 0.76 mg/dL (ref 0.61–1.24)
GFR calc non Af Amer: >60 mL/min (ref >60)
Glucose, Bld: 115 mg/dL — ABNORMAL HIGH (ref 65–99)
Glucose, Bld: 141 mg/dL — ABNORMAL HIGH (ref 65–99)
POTASSIUM: 4.1 mmol/L (ref 3.5–5.1)
Potassium: 4 mmol/L (ref 3.5–5.1)
SODIUM: 127 mmol/L — AB (ref 135–145)
SODIUM: 128 mmol/L — AB (ref 135–145)

## 2017-10-06 LAB — MAGNESIUM
MAGNESIUM: 1.2 mg/dL — AB (ref 1.7–2.4)
MAGNESIUM: 1.6 mg/dL — AB (ref 1.7–2.4)

## 2017-10-06 LAB — PHOSPHORUS: PHOSPHORUS: 2.4 mg/dL — AB (ref 2.5–4.6)

## 2017-10-06 MED ORDER — MAGNESIUM SULFATE 4 GM/100ML IV SOLN
4.0000 g | Freq: Once | INTRAVENOUS | Status: AC
  Administered 2017-10-06: 4 g via INTRAVENOUS
  Filled 2017-10-06: qty 100

## 2017-10-06 MED ORDER — POTASSIUM PHOSPHATES 15 MMOLE/5ML IV SOLN
30.0000 mmol | Freq: Once | INTRAVENOUS | Status: AC
  Administered 2017-10-06: 30 mmol via INTRAVENOUS
  Filled 2017-10-06: qty 10

## 2017-10-06 NOTE — Progress Notes (Signed)
Labs within acceptable levels for discharge per Dr. Pearline Cables.  Pt discharged per orders. Pt denies pain or discomfort, VSS. Pt able to ambulate and dress self independently. No prescriptions to be given. Follow up appointments, home care, medications discussed. When to call the doctor/EMS discussed. Time allowed for questions/concerns. Patient's significant other present for discharge teaching/instructions.  Patient will leave the unit via wheelchair for transport by personal vehicle. Malik Lambert Catheryne Deford,RN

## 2017-10-06 NOTE — Progress Notes (Signed)
[facility] Progress Note Patient Name: Malik Lambert DOB: 10-May-1966 MRN: [id number]   Recent Labs  Lab 10/03/17 2018 10/03/17 2025 10/03/17 2357 10/04/17 0825 10/06/17 0227  NA 128* 129*  --  129* 127*  K 3.3* 3.3*  --  3.9 4.1  CL 95* 97*  --  100* 92*  CO2 14*  --   --  19* 27  GLUCOSE 89 81  --  156* 115*  BUN 34* 29*  --  22* 17  CREATININE 1.72* 1.60*  --  1.05 0.76  CALCIUM 8.2*  --   --  7.3* 8.4*  MG  --   --  1.4* 2.3 1.2*  PHOS  --   --  2.8 2.2* <1.0*     Criticallly low mag and phos; proabably due to refeeding  Plan Hold off dc 10/06/2017  Repletre mag and phos Dc home when these are normalized  Intervention Category Major Interventions: Electrolyte abnormality - evaluation and management  Carleton [place] 10/06/2017, 4:12 AM

## 2017-10-08 LAB — CULTURE, BLOOD (ROUTINE X 2)
Culture: NO GROWTH
Culture: NO GROWTH
Special Requests: ADEQUATE
Special Requests: ADEQUATE

## 2018-03-07 DEATH — deceased

## 2018-05-07 IMAGING — RF DG HIP (WITH PELVIS) OPERATIVE*R*
1 series · 6 of 6 positions shown · non-contrast
Comparison: 01/27/2017.

CLINICAL DATA: 51-year-old male with right intratrochanteric
fracture. Subsequent encounter.

EXAM:
OPERATIVE right HIP (WITH PELVIS IF PERFORMED) 6 VIEWS
TECHNIQUE: Fluoroscopic spot image(s) were submitted for interpretation
post-operatively.

[Series 1: run · 6 of 6 slices shown]
[im 1/6]
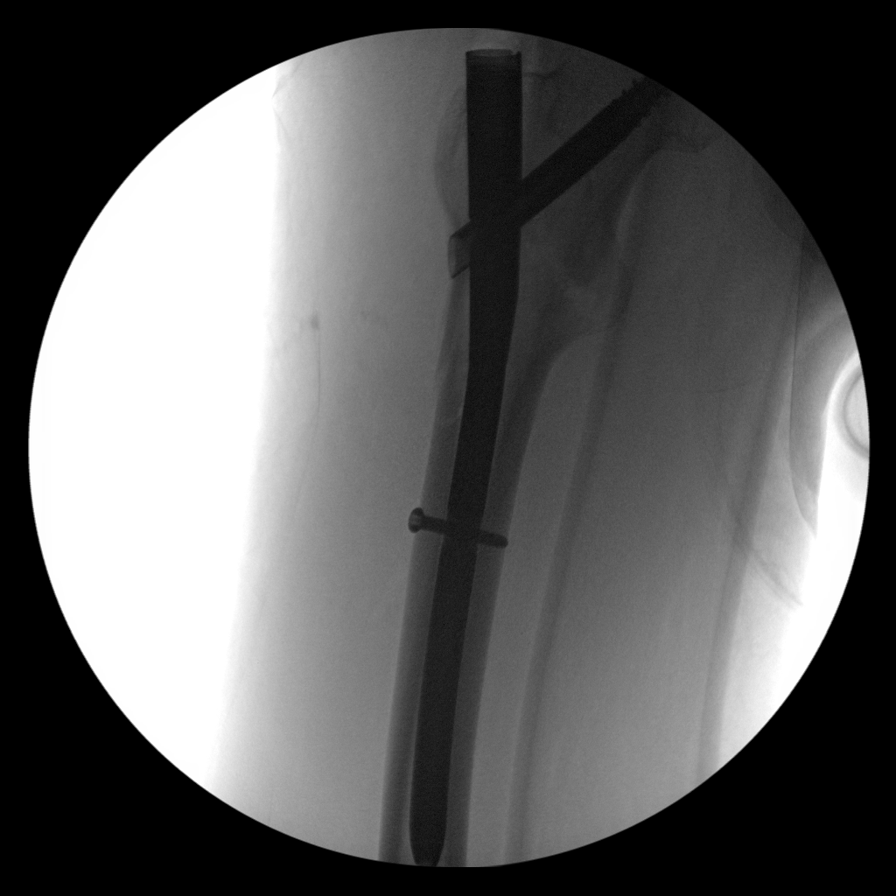
[im 2/6]
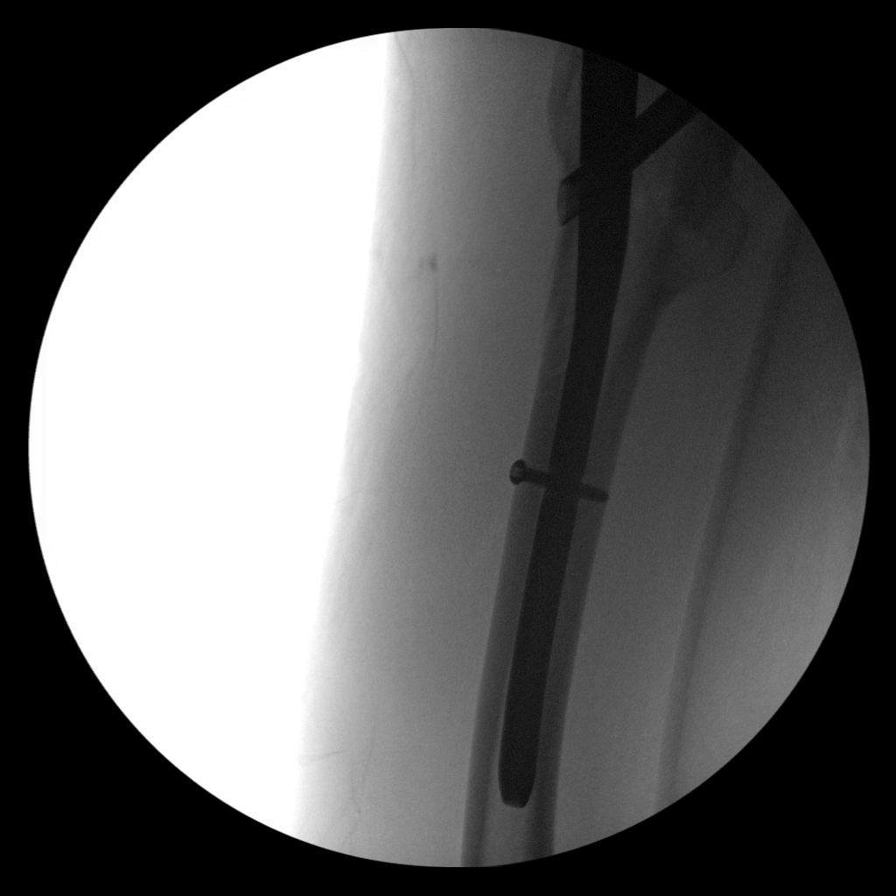
[im 3/6]
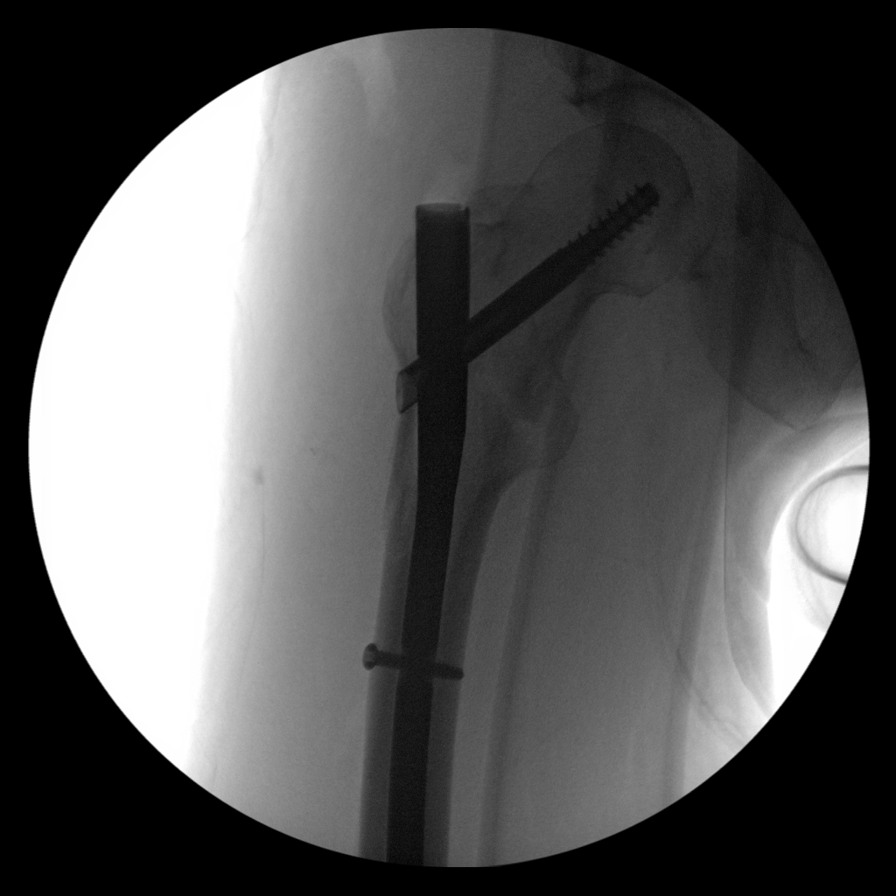
[im 4/6]
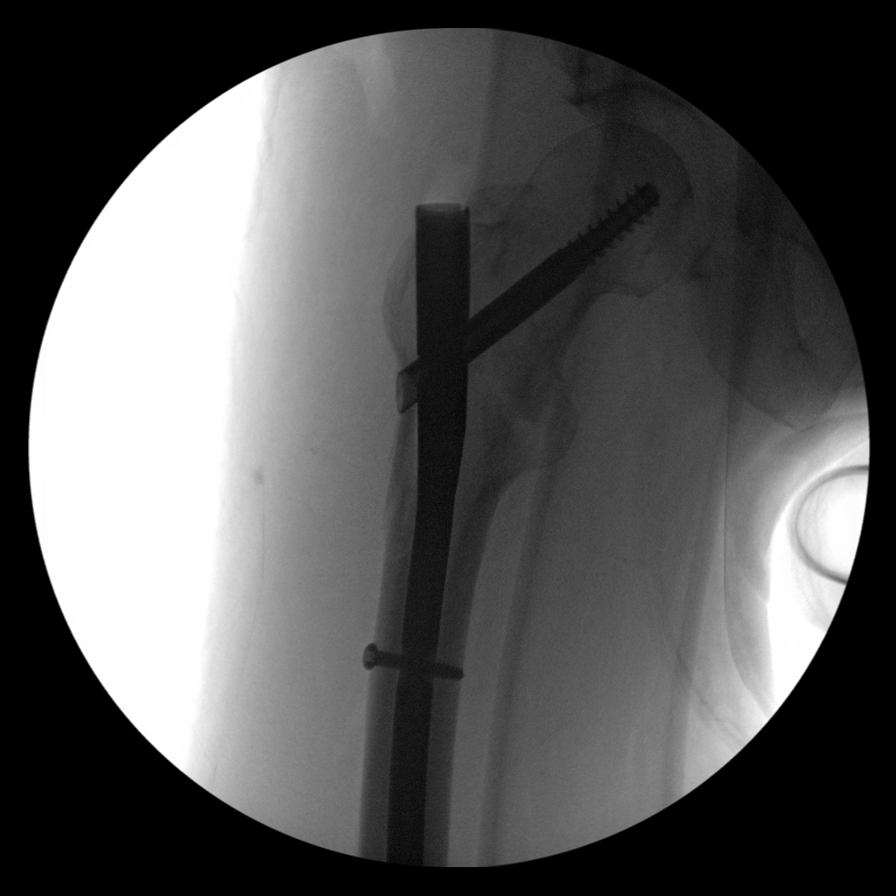
[im 5/6]
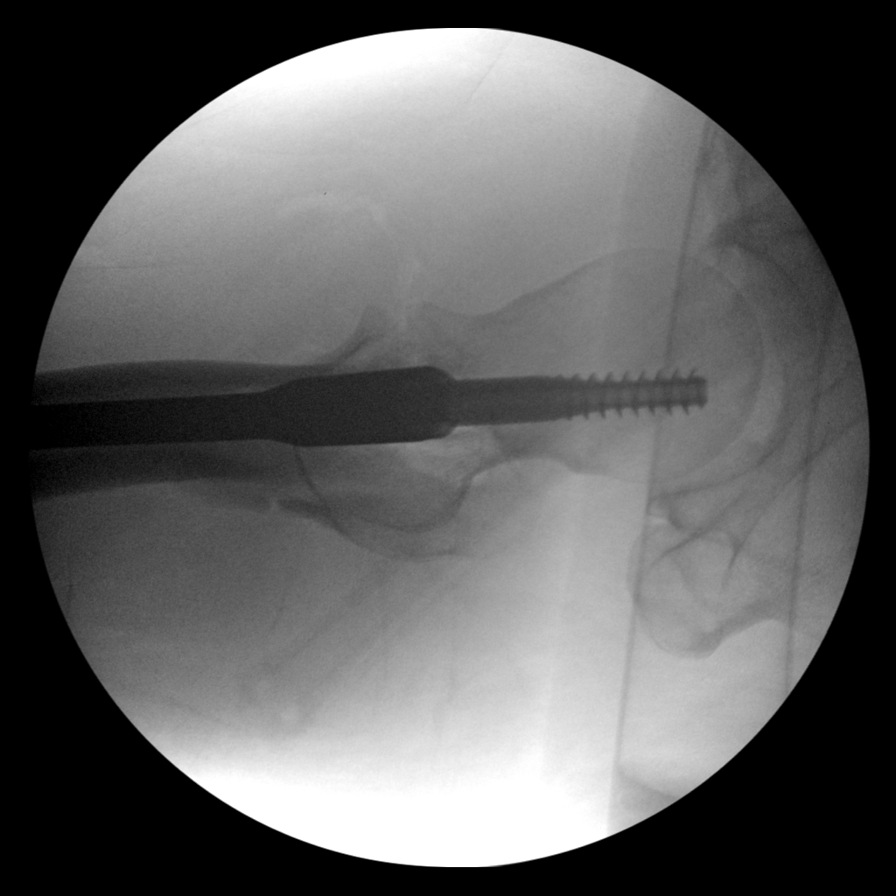
[im 6/6]
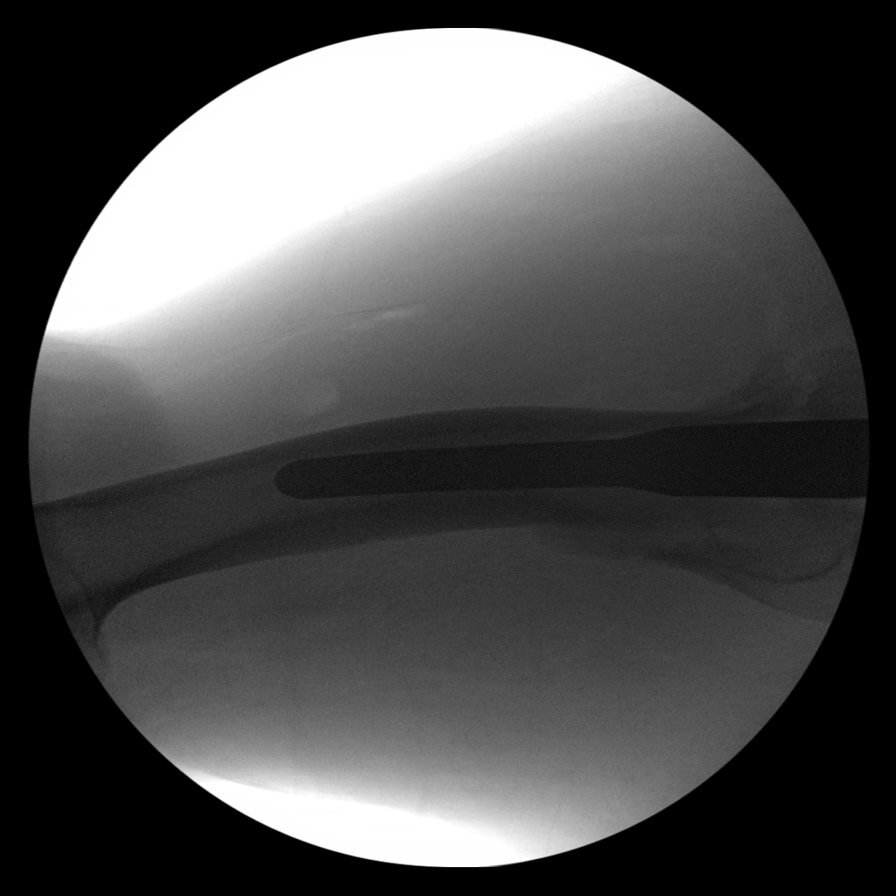

[6 of 6 positions shown; findings below may reference images not displayed]

FINDINGS: Right femoral intramedullary rod with proximal sliding type screw
and mid to distal fixation screw transfix right intertrochanteric
fracture without complication identified. This can be assessed on
follow-up.
IMPRESSION: Open reduction and internal fixation right intertrochanteric
fracture.

## 2018-05-23 IMAGING — CR DG HIP (WITH OR WITHOUT PELVIS) 2-3V*R*
3 series · 3 of 3 positions shown · non-contrast
Comparison: 01/29/2017

CLINICAL DATA: Right hip and knee pain

EXAM:
DG HIP (WITH OR WITHOUT PELVIS) 2-3V RIGHT

[t pelvis ap]
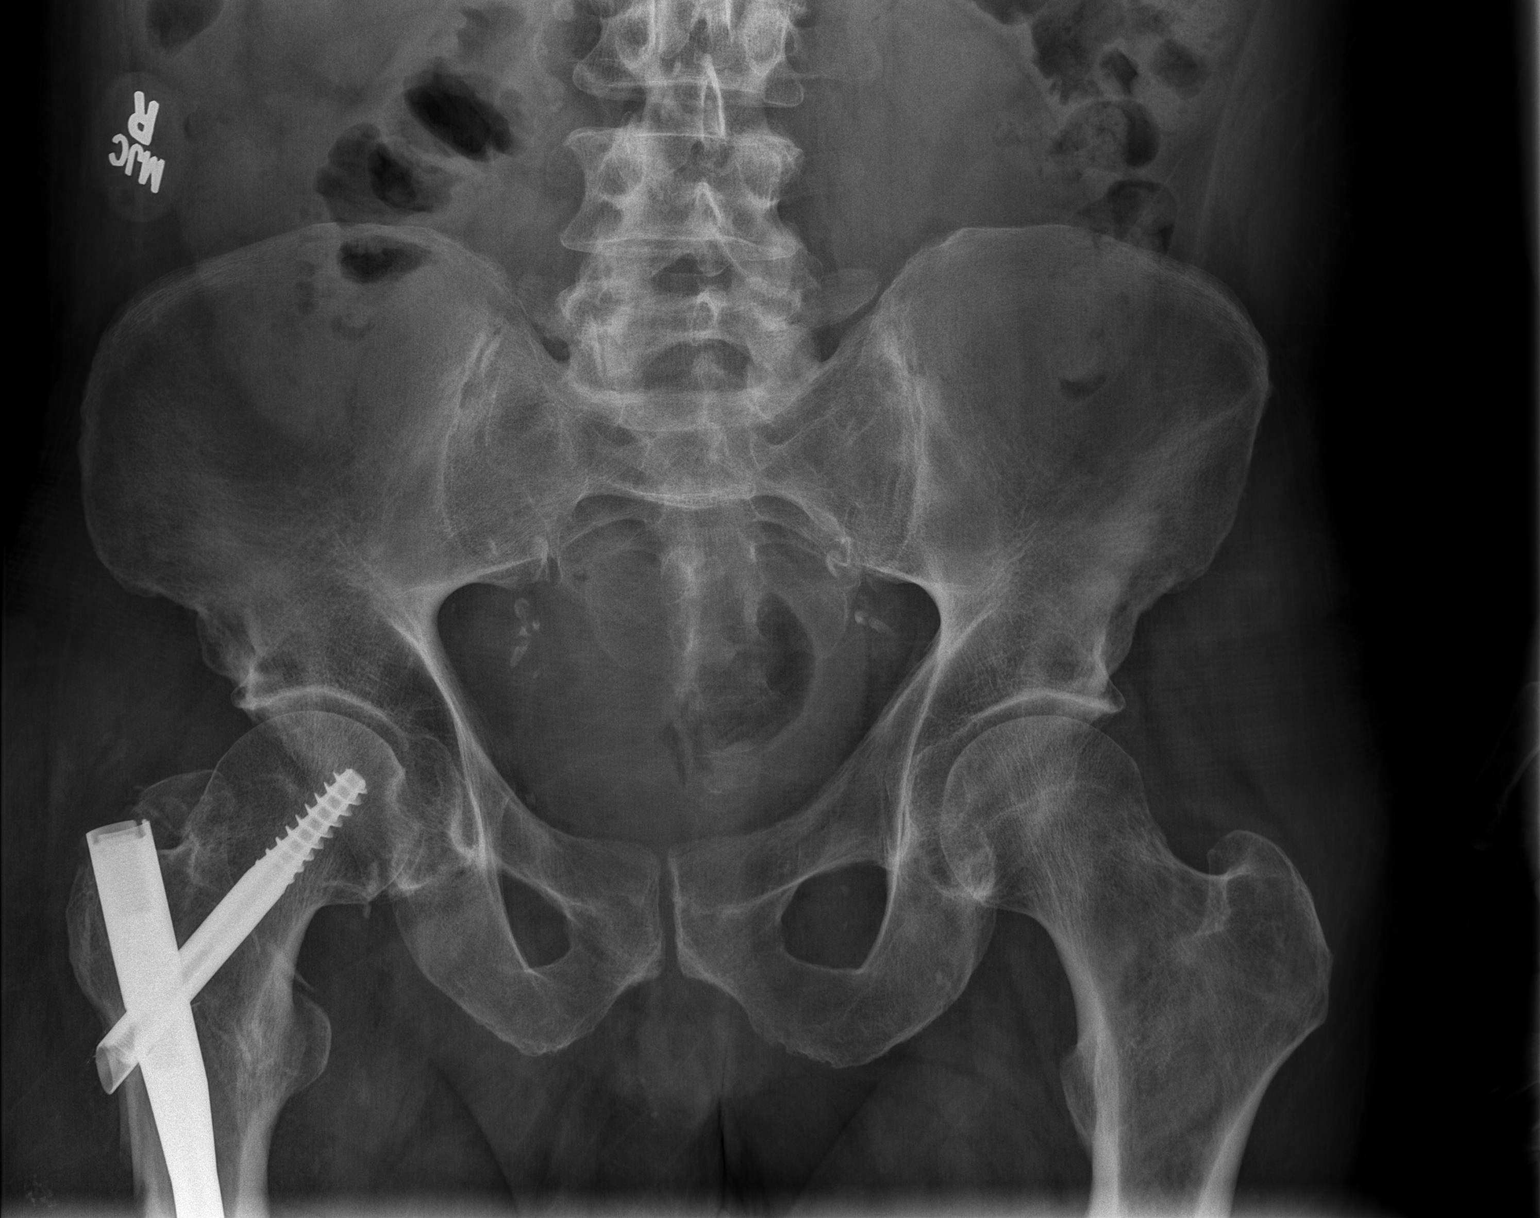

[t hip frog leg right]
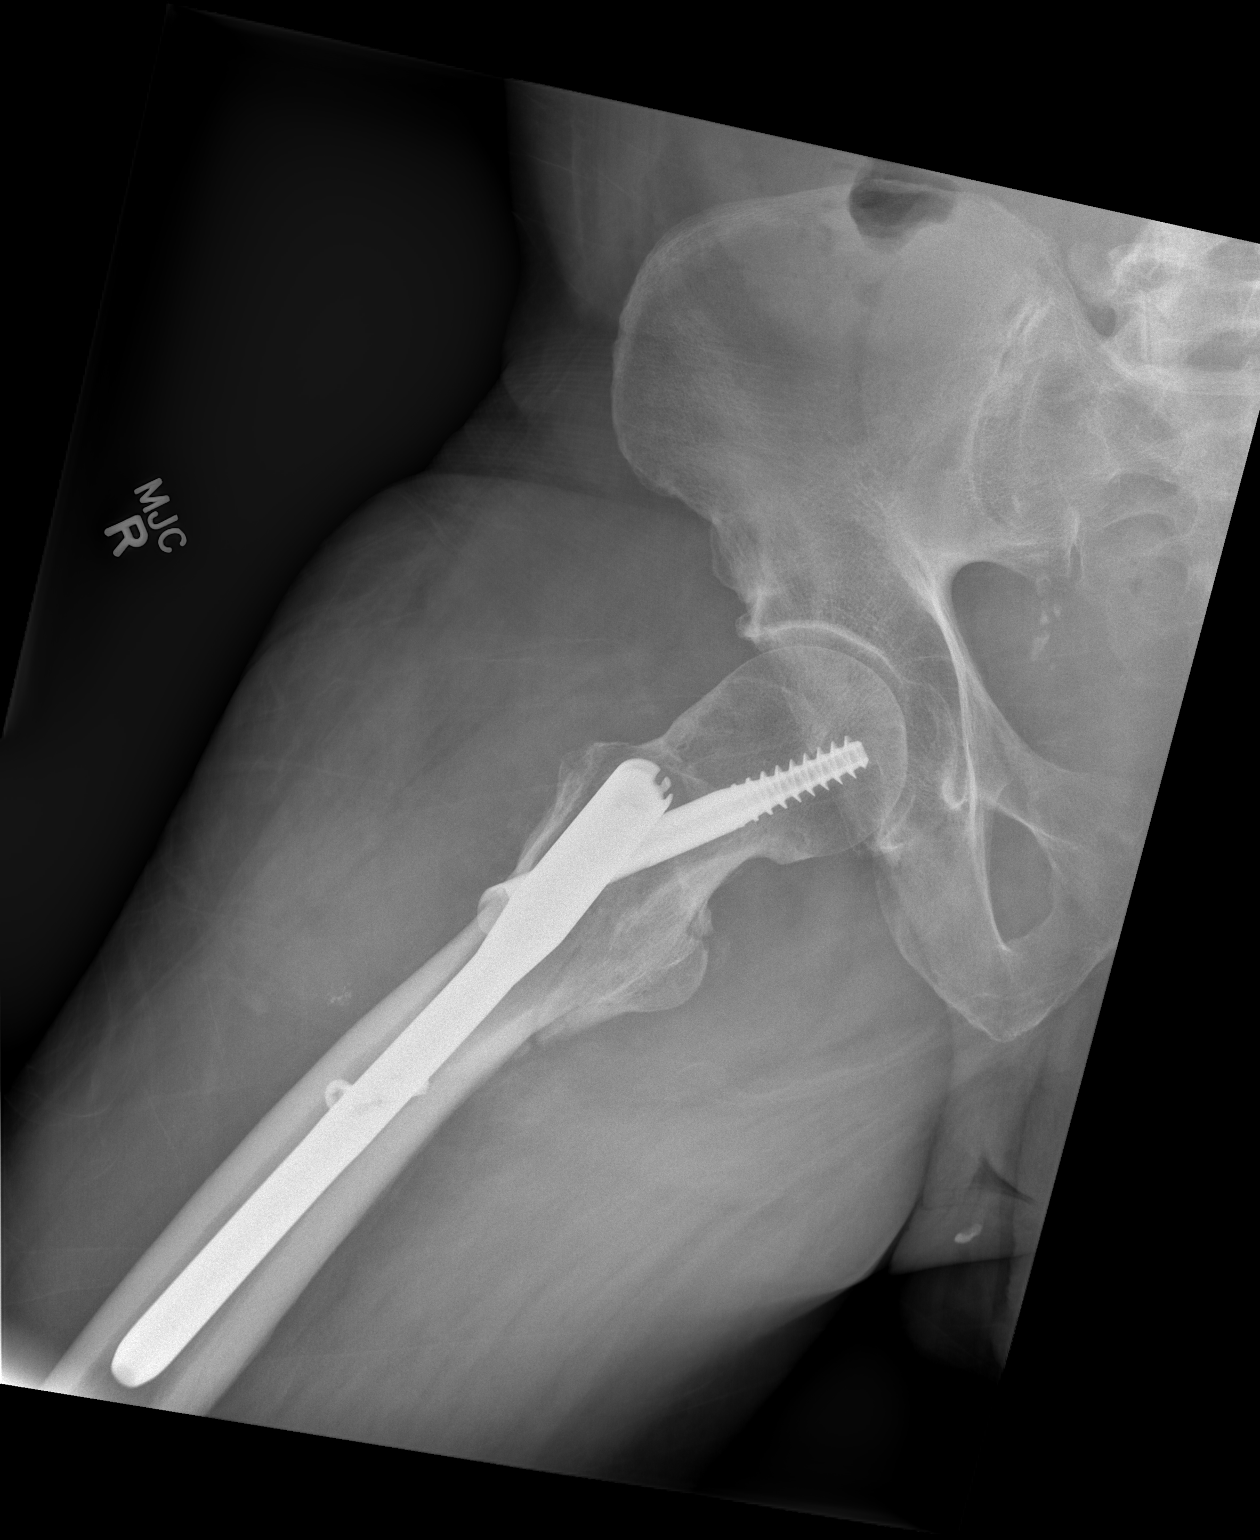

[t hip ap right]
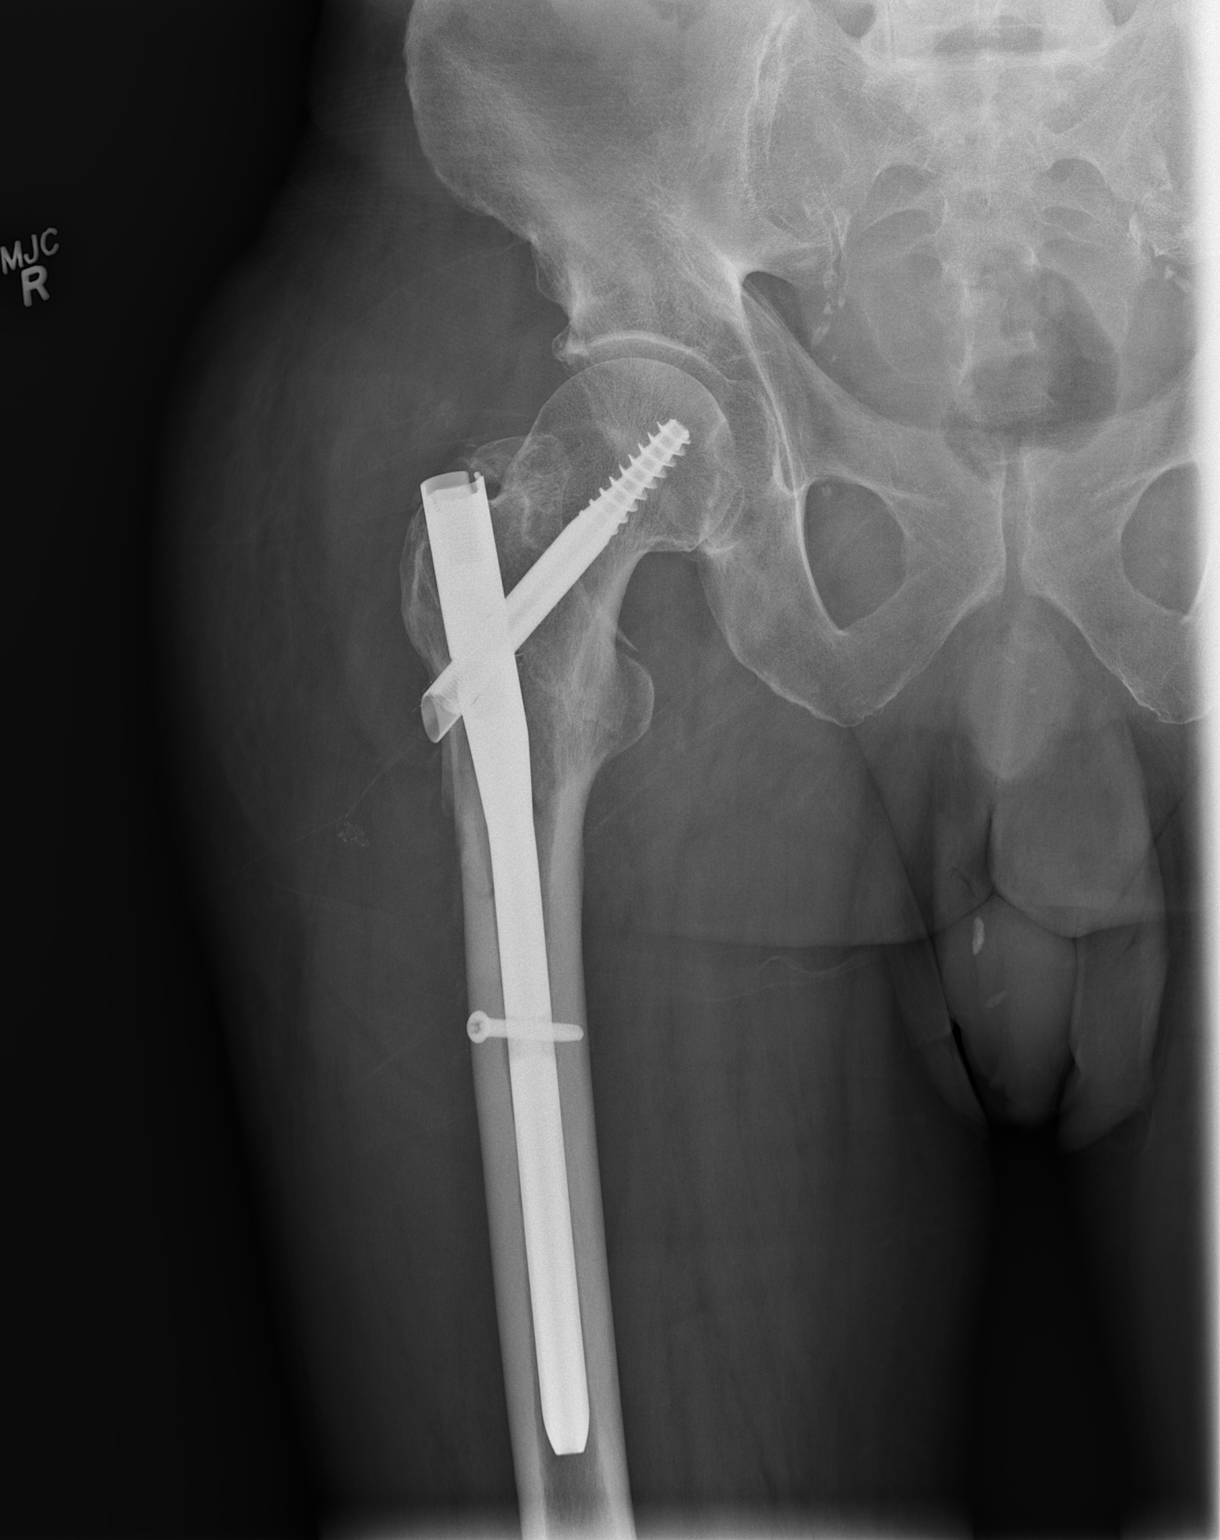

[3 of 3 positions shown; findings below may reference images not displayed]

FINDINGS: Intramedullary rod and dynamic compression screw in the proximal
right femur is stable. There is 1 interlocking screw in the proximal
femur. This transfixes an intertrochanteric femur fracture. There is
anatomic alignment of the bony fragments. No breakage or loosening
of the hardware. No new fractures are present. Mild degenerative
change of the hip and SI joints.
IMPRESSION: ORIF proximal right femur fracture.
